# Patient Record
Sex: Female | Born: 1965 | Race: White | Hispanic: No | Marital: Married | Smoking: Never smoker
Health system: Southern US, Community
[De-identification: ages and names within clinical notes are randomized; demographics above are authoritative.]

## PROBLEM LIST (undated history)

## (undated) DIAGNOSIS — M179 Osteoarthritis of knee, unspecified: Secondary | ICD-10-CM

## (undated) DIAGNOSIS — M1710 Unilateral primary osteoarthritis, unspecified knee: Secondary | ICD-10-CM

## (undated) DIAGNOSIS — S83519A Sprain of anterior cruciate ligament of unspecified knee, initial encounter: Secondary | ICD-10-CM

## (undated) DIAGNOSIS — S83207A Unspecified tear of unspecified meniscus, current injury, left knee, initial encounter: Secondary | ICD-10-CM

## (undated) HISTORY — PX: BREAST BIOPSY: SHX20

---

## 1994-07-23 HISTORY — PX: TONSILLECTOMY: SUR1361

## 1997-10-08 ENCOUNTER — Other Ambulatory Visit: Admission: RE | Admit: 1997-10-08 | Discharge: 1997-10-08 | Payer: Self-pay | Admitting: Obstetrics and Gynecology

## 1998-04-11 ENCOUNTER — Inpatient Hospital Stay (HOSPITAL_COMMUNITY): Admission: AD | Admit: 1998-04-11 | Discharge: 1998-04-13 | Payer: Self-pay | Admitting: Obstetrics and Gynecology

## 1998-04-14 ENCOUNTER — Encounter (HOSPITAL_COMMUNITY): Admission: RE | Admit: 1998-04-14 | Discharge: 1998-07-13 | Payer: Self-pay | Admitting: Obstetrics and Gynecology

## 1998-05-13 ENCOUNTER — Other Ambulatory Visit: Admission: RE | Admit: 1998-05-13 | Discharge: 1998-05-13 | Payer: Self-pay | Admitting: Obstetrics and Gynecology

## 1999-03-15 ENCOUNTER — Other Ambulatory Visit: Admission: RE | Admit: 1999-03-15 | Discharge: 1999-03-15 | Payer: Self-pay | Admitting: Obstetrics and Gynecology

## 1999-09-22 ENCOUNTER — Inpatient Hospital Stay (HOSPITAL_COMMUNITY): Admission: AD | Admit: 1999-09-22 | Discharge: 1999-09-24 | Payer: Self-pay | Admitting: Obstetrics and Gynecology

## 1999-09-25 ENCOUNTER — Encounter: Admission: RE | Admit: 1999-09-25 | Discharge: 1999-11-21 | Payer: Self-pay | Admitting: Obstetrics and Gynecology

## 1999-10-27 ENCOUNTER — Other Ambulatory Visit: Admission: RE | Admit: 1999-10-27 | Discharge: 1999-10-27 | Payer: Self-pay | Admitting: Obstetrics and Gynecology

## 2000-10-07 ENCOUNTER — Other Ambulatory Visit: Admission: RE | Admit: 2000-10-07 | Discharge: 2000-10-07 | Payer: Self-pay | Admitting: Obstetrics and Gynecology

## 2001-04-25 ENCOUNTER — Inpatient Hospital Stay (HOSPITAL_COMMUNITY): Admission: AD | Admit: 2001-04-25 | Discharge: 2001-04-28 | Payer: Self-pay | Admitting: Obstetrics and Gynecology

## 2001-04-29 ENCOUNTER — Encounter: Admission: RE | Admit: 2001-04-29 | Discharge: 2001-05-29 | Payer: Self-pay | Admitting: Obstetrics and Gynecology

## 2001-05-30 ENCOUNTER — Encounter: Admission: RE | Admit: 2001-05-30 | Discharge: 2001-06-29 | Payer: Self-pay | Admitting: Obstetrics and Gynecology

## 2001-06-05 ENCOUNTER — Other Ambulatory Visit: Admission: RE | Admit: 2001-06-05 | Discharge: 2001-06-05 | Payer: Self-pay | Admitting: Obstetrics and Gynecology

## 2002-07-09 ENCOUNTER — Other Ambulatory Visit: Admission: RE | Admit: 2002-07-09 | Discharge: 2002-07-09 | Payer: Self-pay | Admitting: Obstetrics and Gynecology

## 2003-09-14 ENCOUNTER — Other Ambulatory Visit: Admission: RE | Admit: 2003-09-14 | Discharge: 2003-09-14 | Payer: Self-pay | Admitting: Obstetrics and Gynecology

## 2004-10-11 ENCOUNTER — Other Ambulatory Visit: Admission: RE | Admit: 2004-10-11 | Discharge: 2004-10-11 | Payer: Self-pay | Admitting: Obstetrics and Gynecology

## 2005-07-23 HISTORY — PX: TUBAL LIGATION: SHX77

## 2005-11-08 ENCOUNTER — Other Ambulatory Visit: Admission: RE | Admit: 2005-11-08 | Discharge: 2005-11-08 | Payer: Self-pay | Admitting: Obstetrics and Gynecology

## 2006-08-29 ENCOUNTER — Ambulatory Visit (HOSPITAL_COMMUNITY): Admission: RE | Admit: 2006-08-29 | Discharge: 2006-08-29 | Payer: Self-pay | Admitting: Obstetrics and Gynecology

## 2012-03-28 ENCOUNTER — Other Ambulatory Visit: Payer: Self-pay | Admitting: Obstetrics and Gynecology

## 2012-03-28 DIAGNOSIS — R928 Other abnormal and inconclusive findings on diagnostic imaging of breast: Secondary | ICD-10-CM

## 2012-04-01 ENCOUNTER — Ambulatory Visit
Admission: RE | Admit: 2012-04-01 | Discharge: 2012-04-01 | Disposition: A | Discharge status: Home / Self Care | Payer: 59 | Source: Ambulatory Visit | Attending: Obstetrics and Gynecology | Admitting: Obstetrics and Gynecology

## 2012-04-01 DIAGNOSIS — R928 Other abnormal and inconclusive findings on diagnostic imaging of breast: Secondary | ICD-10-CM

## 2012-04-23 ENCOUNTER — Other Ambulatory Visit: Payer: Self-pay | Admitting: Obstetrics and Gynecology

## 2012-04-23 DIAGNOSIS — R921 Mammographic calcification found on diagnostic imaging of breast: Secondary | ICD-10-CM

## 2012-05-01 ENCOUNTER — Other Ambulatory Visit: Payer: Self-pay | Admitting: Obstetrics and Gynecology

## 2012-05-01 ENCOUNTER — Ambulatory Visit
Admission: RE | Admit: 2012-05-01 | Discharge: 2012-05-01 | Disposition: A | Discharge status: Home / Self Care | Payer: 59 | Source: Ambulatory Visit | Attending: Obstetrics and Gynecology | Admitting: Obstetrics and Gynecology

## 2012-05-01 DIAGNOSIS — R921 Mammographic calcification found on diagnostic imaging of breast: Secondary | ICD-10-CM

## 2012-05-01 DIAGNOSIS — N6320 Unspecified lump in the left breast, unspecified quadrant: Secondary | ICD-10-CM

## 2013-06-23 ENCOUNTER — Other Ambulatory Visit: Payer: Self-pay | Admitting: Physician Assistant

## 2013-06-23 DIAGNOSIS — S0993XD Unspecified injury of face, subsequent encounter: Secondary | ICD-10-CM

## 2013-06-23 DIAGNOSIS — S0993XA Unspecified injury of face, initial encounter: Secondary | ICD-10-CM | POA: Insufficient documentation

## 2013-06-23 NOTE — Progress Notes (Signed)
Patient ID: Kathy [name], female   DOB: [birth year], 47 y.o.   MRN: [id number] Subjective:     Patient ID: Kathy [name] is a 47 y.o. female.  HPI The patient is a 47 yrs old wf here for evaluation of a laceration on her nose and forehead.  She was in the [place] when she fell off a boat and hit her head and knee.  She was sutured there three days ago.  There is some swelling so it is difficult to tell if it is broken. She does not have any difficulty breathing. The forehead laceration is healing and without signs of infection, but the edges did have some overlap and is prominent at this time. She does have some deviation of her nasal bridge to the left. She is not having problems with breathing. She is going to Dr. Jim Desanctis today for further assessment of her left knee and may have an ACL tear that may require surgery.   The following portions of the patient's history were reviewed and updated as appropriate: allergies, current medications, past family history, past medical history, past social history, past surgical history and problem list.  Review of Systems  All other systems reviewed and are negative.       Objective:   Physical Exam  Constitutional: She is oriented to person, place, and time. She appears well-developed and well-nourished. No distress.  Ambulates with brace on the left knee  HENT:  The forehead laceration is healing and without signs of infection, but the edges did have some overlap and is prominent at this time. She does have some deviation of her nasal bridge to the left. She is not having problems with breathing  Eyes: Conjunctivae and EOM are normal. Pupils are equal, round, and reactive to light.  Cardiovascular: Normal rate and regular rhythm.   Pulmonary/Chest: Effort normal. No respiratory distress.  Musculoskeletal:  Left knee with bracing  Neurological: She is alert and oriented to person, place, and time. No cranial nerve deficit. She exhibits normal  muscle tone. Coordination normal.  Skin: Skin is warm and dry. No rash noted. No erythema.  Psychiatric: She has a normal mood and affect. Her behavior is normal. Judgment and thought content normal.       Assessment:     1. Facial trauma, subsequent encounter         Plan:     Maxillo-facial CT scan to evaluate facial trauma She is likely going to need left knee surgery and we can Coordinate with  Orthopedics once we know the plan.      Electronically signed by: Kathy Lahaie Montgomery Advait Buice, PA-C 06/23/2013 9:58 AM

## 2013-06-26 ENCOUNTER — Ambulatory Visit (HOSPITAL_COMMUNITY)
Admission: RE | Admit: 2013-06-26 | Discharge: 2013-06-26 | Disposition: A | Discharge status: Home / Self Care | Payer: 59 | Source: Ambulatory Visit | Attending: Physician Assistant | Admitting: Physician Assistant

## 2013-06-26 ENCOUNTER — Encounter (HOSPITAL_COMMUNITY): Payer: Self-pay

## 2013-06-26 DIAGNOSIS — S022XXA Fracture of nasal bones, initial encounter for closed fracture: Secondary | ICD-10-CM | POA: Insufficient documentation

## 2013-06-26 DIAGNOSIS — X58XXXA Exposure to other specified factors, initial encounter: Secondary | ICD-10-CM | POA: Insufficient documentation

## 2013-06-26 DIAGNOSIS — S0993XD Unspecified injury of face, subsequent encounter: Secondary | ICD-10-CM

## 2013-07-07 DIAGNOSIS — S022XXG Fracture of nasal bones, subsequent encounter for fracture with delayed healing: Secondary | ICD-10-CM | POA: Insufficient documentation

## 2013-07-27 ENCOUNTER — Encounter (HOSPITAL_BASED_OUTPATIENT_CLINIC_OR_DEPARTMENT_OTHER): Payer: Self-pay | Admitting: *Deleted

## 2013-07-27 NOTE — Progress Notes (Signed)
NPO AFTER MN.  ARRIVE AT 0700.  NEEDS HG.  

## 2013-07-28 ENCOUNTER — Other Ambulatory Visit: Payer: Self-pay | Admitting: Plastic Surgery

## 2013-07-28 DIAGNOSIS — S022XXD Fracture of nasal bones, subsequent encounter for fracture with routine healing: Secondary | ICD-10-CM

## 2013-07-28 NOTE — H&P (Signed)
Kathy [name] Darrah  07/07/2013 8:30 AM   Office Visit  MRN:  [id number]  Department:  Plastic Surgery  Dept Phone: [phone]   Description: Female DOB: 11/05/1965  Provider: Wayland Denislaire [name], DO    Diagnoses    Facial trauma, sequela     908.9    Closed displaced fracture of nasal bone with delayed healing   V54.19     Vitals - Last Recorded   100/63  67  1.651 m (5\' 5" )  67.132 kg (148 lb)  24.63 kg/m2  98%     Subjective:    Patient ID: Kathy [name] is a 48 y.o. female.  HPI The patient is a 48 yrs old wf here for follow up of laceration on her nose and forehead.  [place] she fell off a boat and hit her head and knee.  She was sutured there.   The forehead laceration is healing and without signs of infection, but the edges did have some overlap and is prominent at this time. She does have some deviation of her nasal bridge to the left. She is not having problems with breathing. She had a Maxillo-facial CT scan which showed displaced nasal and septal fractures. She is here for a history and physical today prior to repair of the fractures.   The following portions of the patient's history were reviewed and updated as appropriate: allergies, current medications, past family history, past medical history, past social history, past surgical history and problem list.  Review of Systems  All other systems reviewed and are negative.     Objective:   Physical Exam  Constitutional: She is oriented to person, place, and time. She appears well-developed and well-nourished. No distress.  HENT:  Nasal deformity with deviation of nose.  Forehead with scarring and overlap of laceration which was sutured at the time of injury.   Eyes: Conjunctivae and EOM are normal. Pupils are equal, round, and reactive to light.  Neck: Normal range of motion. Neck supple. No tracheal deviation present. No thyromegaly present.  Cardiovascular: Normal rate, regular rhythm, normal heart sounds and  intact distal pulses.  Exam reveals no gallop and no friction rub.    No murmur heard. Pulmonary/Chest: Effort normal and breath sounds normal. No stridor. No respiratory distress. She has no wheezes. She exhibits no tenderness.  Abdominal: Soft. Bowel sounds are normal. She exhibits no mass. There is no tenderness.  Musculoskeletal:  Left knee injury with bracing and ambulates with limp due to this.   Neurological: She is alert and oriented to person, place, and time. No cranial nerve deficit. She exhibits normal muscle tone. Coordination normal.  Skin: Skin is warm and dry. No rash noted. No erythema.  Psychiatric: She has a normal mood and affect. Her behavior is normal. Judgment and thought content normal.      Assessment:   1.  Facial trauma, sequela    2.  Closed displaced fracture of nasal bone with delayed healing       Plan:   Plan closed reduction of nasal and septal fractures on 07/29/2013.   The procedure, possible risks, benefits and complications were discussed with the patient and she desires to proceed and consent was obtained.   follow up 1 week following surgery     Medications Ordered This Encounter    HYDROcodone-acetaminophen (NORCO) 5-325 mg per tablet Take 1 tablet by mouth every 6 (six) hours as needed for up to 10 days for Pain.  cephalexin (KEFLEX) 500 MG capsule  Take 1 capsule (500 mg total) by mouth 4 times daily.

## 2013-07-29 ENCOUNTER — Encounter (HOSPITAL_BASED_OUTPATIENT_CLINIC_OR_DEPARTMENT_OTHER): Payer: 59 | Admitting: Anesthesiology

## 2013-07-29 ENCOUNTER — Ambulatory Visit (HOSPITAL_BASED_OUTPATIENT_CLINIC_OR_DEPARTMENT_OTHER): Payer: 59 | Admitting: Anesthesiology

## 2013-07-29 ENCOUNTER — Encounter (HOSPITAL_BASED_OUTPATIENT_CLINIC_OR_DEPARTMENT_OTHER)
Admission: RE | Disposition: A | Discharge status: Home / Self Care | Payer: Self-pay | Source: Ambulatory Visit | Attending: Plastic Surgery

## 2013-07-29 ENCOUNTER — Ambulatory Visit (HOSPITAL_BASED_OUTPATIENT_CLINIC_OR_DEPARTMENT_OTHER)
Admission: RE | Admit: 2013-07-29 | Discharge: 2013-07-29 | Disposition: A | Discharge status: Home / Self Care | Payer: 59 | Source: Ambulatory Visit | Attending: Plastic Surgery | Admitting: Plastic Surgery

## 2013-07-29 ENCOUNTER — Encounter (HOSPITAL_BASED_OUTPATIENT_CLINIC_OR_DEPARTMENT_OTHER): Payer: Self-pay | Admitting: *Deleted

## 2013-07-29 DIAGNOSIS — S0180XA Unspecified open wound of other part of head, initial encounter: Secondary | ICD-10-CM | POA: Insufficient documentation

## 2013-07-29 DIAGNOSIS — S022XXA Fracture of nasal bones, initial encounter for closed fracture: Secondary | ICD-10-CM | POA: Insufficient documentation

## 2013-07-29 DIAGNOSIS — S022XXD Fracture of nasal bones, subsequent encounter for fracture with routine healing: Secondary | ICD-10-CM

## 2013-07-29 HISTORY — PX: CLOSED REDUCTION NASAL FRACTURE: SHX5365

## 2013-07-29 SURGERY — CLOSED REDUCTION, FRACTURE, NASAL BONE
Anesthesia: General | Site: Nose

## 2013-07-29 MED ORDER — COCAINE HCL 4 % EX SOLN
CUTANEOUS | Status: DC | PRN
  Administered 2013-07-29: 1 mL via TOPICAL

## 2013-07-29 MED ORDER — BUPIVACAINE-EPINEPHRINE 0.25% -1:200000 IJ SOLN
INTRAMUSCULAR | Status: DC | PRN
  Administered 2013-07-29: 7 mL

## 2013-07-29 MED ORDER — MIDAZOLAM HCL 2 MG/2ML IJ SOLN
INTRAMUSCULAR | Status: AC
  Filled 2013-07-29: qty 4

## 2013-07-29 MED ORDER — CEFAZOLIN SODIUM-DEXTROSE 2-3 GM-% IV SOLR
2.0000 g | INTRAVENOUS | Status: DC
  Filled 2013-07-29: qty 50

## 2013-07-29 MED ORDER — ROCURONIUM BROMIDE 100 MG/10ML IV SOLN
INTRAVENOUS | Status: DC | PRN
  Administered 2013-07-29: 10 mg via INTRAVENOUS
  Administered 2013-07-29: 30 mg via INTRAVENOUS

## 2013-07-29 MED ORDER — PROPOFOL 10 MG/ML IV BOLUS
INTRAVENOUS | Status: DC | PRN
  Administered 2013-07-29: 50 mg via INTRAVENOUS
  Administered 2013-07-29: 200 mg via INTRAVENOUS

## 2013-07-29 MED ORDER — EPHEDRINE SULFATE 50 MG/ML IJ SOLN
INTRAMUSCULAR | Status: DC | PRN
  Administered 2013-07-29 (×2): 10 mg via INTRAVENOUS

## 2013-07-29 MED ORDER — CEFAZOLIN SODIUM-DEXTROSE 2-3 GM-% IV SOLR
INTRAVENOUS | Status: DC | PRN
  Administered 2013-07-29: 2 g via INTRAVENOUS

## 2013-07-29 MED ORDER — PROMETHAZINE HCL 25 MG/ML IJ SOLN
6.2500 mg | INTRAMUSCULAR | Status: DC | PRN
  Filled 2013-07-29: qty 1

## 2013-07-29 MED ORDER — GLYCOPYRROLATE 0.2 MG/ML IJ SOLN
INTRAMUSCULAR | Status: DC | PRN
  Administered 2013-07-29: 0.2 mg via INTRAVENOUS
  Administered 2013-07-29: 0.4 mg via INTRAVENOUS

## 2013-07-29 MED ORDER — SUCCINYLCHOLINE CHLORIDE 20 MG/ML IJ SOLN
INTRAMUSCULAR | Status: DC | PRN
  Administered 2013-07-29: 100 mg via INTRAVENOUS

## 2013-07-29 MED ORDER — LIDOCAINE-EPINEPHRINE 1 %-1:100000 IJ SOLN
INTRAMUSCULAR | Status: DC | PRN
  Administered 2013-07-29: 12 mL

## 2013-07-29 MED ORDER — LIDOCAINE HCL (CARDIAC) 20 MG/ML IV SOLN
INTRAVENOUS | Status: DC | PRN
  Administered 2013-07-29: 100 mg via INTRAVENOUS

## 2013-07-29 MED ORDER — ONDANSETRON HCL 4 MG/2ML IJ SOLN
INTRAMUSCULAR | Status: DC | PRN
  Administered 2013-07-29: 4 mg via INTRAVENOUS

## 2013-07-29 MED ORDER — LACTATED RINGERS IV SOLN
INTRAVENOUS | Status: DC
  Administered 2013-07-29 (×2): via INTRAVENOUS
  Filled 2013-07-29: qty 1000

## 2013-07-29 MED ORDER — FENTANYL CITRATE 0.05 MG/ML IJ SOLN
INTRAMUSCULAR | Status: DC | PRN
  Administered 2013-07-29 (×2): 25 ug via INTRAVENOUS
  Administered 2013-07-29: 50 ug via INTRAVENOUS
  Administered 2013-07-29 (×5): 25 ug via INTRAVENOUS
  Administered 2013-07-29: 50 ug via INTRAVENOUS
  Administered 2013-07-29: 25 ug via INTRAVENOUS

## 2013-07-29 MED ORDER — FENTANYL CITRATE 0.05 MG/ML IJ SOLN
INTRAMUSCULAR | Status: AC
  Filled 2013-07-29: qty 6

## 2013-07-29 MED ORDER — KETOROLAC TROMETHAMINE 30 MG/ML IJ SOLN
INTRAMUSCULAR | Status: DC | PRN
  Administered 2013-07-29: 30 mg via INTRAVENOUS

## 2013-07-29 MED ORDER — MEPERIDINE HCL 25 MG/ML IJ SOLN
6.2500 mg | INTRAMUSCULAR | Status: DC | PRN
  Filled 2013-07-29: qty 1

## 2013-07-29 MED ORDER — NEOSTIGMINE METHYLSULFATE 1 MG/ML IJ SOLN
INTRAMUSCULAR | Status: DC | PRN
Start: 2013-07-29 — End: 2013-07-29
  Administered 2013-07-29: 3 mg via INTRAVENOUS

## 2013-07-29 MED ORDER — MIDAZOLAM HCL 5 MG/5ML IJ SOLN
INTRAMUSCULAR | Status: DC | PRN
  Administered 2013-07-29 (×2): 1 mg via INTRAVENOUS

## 2013-07-29 MED ORDER — FENTANYL CITRATE 0.05 MG/ML IJ SOLN
25.0000 ug | INTRAMUSCULAR | Status: DC | PRN
  Filled 2013-07-29: qty 1

## 2013-07-29 MED ORDER — LACTATED RINGERS IV SOLN
INTRAVENOUS | Status: DC
  Filled 2013-07-29: qty 1000

## 2013-07-29 MED ORDER — DEXAMETHASONE SODIUM PHOSPHATE 4 MG/ML IJ SOLN
INTRAMUSCULAR | Status: DC | PRN
  Administered 2013-07-29: 10 mg via INTRAVENOUS

## 2013-07-29 MED ORDER — LACTATED RINGERS IV SOLN
INTRAVENOUS | Status: DC | PRN
  Administered 2013-07-29 (×2): via INTRAVENOUS

## 2013-07-29 MED ORDER — OXYMETAZOLINE HCL 0.05 % NA SOLN
NASAL | Status: DC | PRN
  Administered 2013-07-29: 1 via NASAL

## 2013-07-29 SURGICAL SUPPLY — 39 items
BENZOIN TINCTURE PRP APPL 2/3 (GAUZE/BANDAGES/DRESSINGS) ×2 IMPLANT
CANISTER SUCTION 1200CC (MISCELLANEOUS) ×2 IMPLANT
CLOTH BEACON ORANGE TIMEOUT ST (SAFETY) IMPLANT
DECANTER SPIKE VIAL GLASS SM (MISCELLANEOUS) IMPLANT
ELECT NEEDLE BLADE 2-5/6 (NEEDLE) ×2 IMPLANT
ELECT REM PT RETURN 9FT ADLT (ELECTROSURGICAL) ×2
ELECTRODE REM PT RTRN 9FT ADLT (ELECTROSURGICAL) ×1 IMPLANT
GAUZE VASELINE FOILPK 1/2 X 72 (GAUZE/BANDAGES/DRESSINGS) IMPLANT
GLOVE BIO SURGEON STRL SZ 6.5 (GLOVE) ×2 IMPLANT
GLOVE BIOGEL M 6.5 STRL (GLOVE) ×6 IMPLANT
GLOVE BIOGEL M STER SZ 6 (GLOVE) ×2 IMPLANT
GLOVE BIOGEL M STRL SZ7.5 (GLOVE) ×2 IMPLANT
GOWN PREVENTION PLUS XLARGE (GOWN DISPOSABLE) ×2 IMPLANT
NDL SAFETY ECLIPSE 18X1.5 (NEEDLE) ×1 IMPLANT
NEEDLE 27GAX1X1/2 (NEEDLE) ×2 IMPLANT
NEEDLE HYPO 18GX1.5 SHARP (NEEDLE) ×1
NEEDLE HYPO 22GX1.5 SAFETY (NEEDLE) ×2 IMPLANT
NEEDLE HYPO 30X.5 LL (NEEDLE) ×2 IMPLANT
PACK BASIN DAY SURGERY FS (CUSTOM PROCEDURE TRAY) ×2 IMPLANT
PACK ENT DAY SURGERY (CUSTOM PROCEDURE TRAY) ×2 IMPLANT
PATTIES SURGICAL .5 X3 (DISPOSABLE) ×2 IMPLANT
PENCIL BUTTON HOLSTER BLD 10FT (ELECTRODE) ×2 IMPLANT
SPLINT ELECTRIC BLUE LARGE (MISCELLANEOUS) IMPLANT
SPLINT ELECTRIC BLUE SMALL (MISCELLANEOUS) IMPLANT
SPLINT HOT PINK LARGE (MISCELLANEOUS) IMPLANT
SPLINT HOT PINK SMALL (MISCELLANEOUS) IMPLANT
SPLINT IVORY SMALL (MISCELLANEOUS) IMPLANT
SPLINT NASAL DOYLE BI-VL (GAUZE/BANDAGES/DRESSINGS) IMPLANT
SPLINT NASAL THERMO PLAST (MISCELLANEOUS) ×2 IMPLANT
SPLINT THERMO PLAST IVORY LRG (MISCELLANEOUS) IMPLANT
SPONGE GAUZE 2X2 8PLY STRL LF (GAUZE/BANDAGES/DRESSINGS) IMPLANT
STRIP CLOSURE SKIN 1/2X4 (GAUZE/BANDAGES/DRESSINGS) ×2 IMPLANT
SUCTION FRAZIER TIP 10 FR DISP (SUCTIONS) ×2 IMPLANT
SUT ETHILON 3 0 PS 1 (SUTURE) IMPLANT
SUT PLAIN 3 0 FS 2 27 (SUTURE) ×2 IMPLANT
TOWEL OR 17X24 6PK STRL BLUE (TOWEL DISPOSABLE) ×4 IMPLANT
TRAY DSU PREP LF (CUSTOM PROCEDURE TRAY) ×2 IMPLANT
TUBE SALEM SUMP 16 FR W/ARV (TUBING) ×2 IMPLANT
YANKAUER SUCT BULB TIP NO VENT (SUCTIONS) IMPLANT

## 2013-07-29 NOTE — Op Note (Signed)
Operative Note   DATE OF OPERATION: 07/29/2013  [facility]ATION[facility]  SURGICAL DIVISION: Plastic Surgery  PREOPERATIVE DIAGNOSES:  Nasal septal fracture  POSTOPERATIVE DIAGNOSES:  same  PROCEDURE:  Open reduction of nasal septal fracture  SURGEON: Wayland Denislaire [name], DO  ASSISTANT: Shawn Rayburn, PA  ANESTHESIA:  General.   COMPLICATIONS: None.   INDICATIONS FOR PROCEDURE:  The patient, Kathy [name], is a 48 y.o. female born on 02/16/1966, is here for treatment of nasal septal fracture.   CONSENT:  Informed consent was obtained directly from the patient. Risks, benefits and alternatives were fully discussed. Specific risks including but not limited to bleeding, infection, hematoma, seroma, scarring, pain, implant infection, implant extrusion, capsular contracture, asymmetry, wound healing problems, and need for further surgery were all discussed. The patient did have an ample opportunity to have questions answered to satisfaction.   DESCRIPTION OF PROCEDURE:  The patient was taken to the operating room. SCDs were placed and IV antibiotics were given. The patient's operative site was prepped and draped in a sterile fashion. A time out was performed and all information was confirmed to be correct.  General and local anesthesia was administered.  The local was injected in the septal area as well as in the periosteal nasal bone area.  The 15 blade was used to make an incision at the base of the septal cartilage.  The freer was used to separate the mucosa from the cartilage on both sides.  The septal cartilage at the juncture of the spine was excised.  This freed up the cartilage to be mobile.  There was a moderate amount of scarring.  A transfiction stitch was placed plain gut 3-0 through the cartilage to secure it to the spine. The incision was closed with 5-0 Plain gut.  The #15 blade was used to make an incision at the base of the lateral nasal bones caudad on each side.  The  freer was used to release the periosteum.  The osteotome and hammer were then used to fracture the nasal bones and allow repositioning with symmetry.  Splints were placed at the septum and secured with nylon.  The steri strips were placed on the skin of the nose and a denver splint applied. The patient's posterior pharynx was aspirated.  She tolerated the procedure well.  There were no complications.  The patient was allowed to wake from anesthesia, extubated and taken to the recovery room in satisfactory condition.

## 2013-07-29 NOTE — Anesthesia Procedure Notes (Signed)
Procedure Name: Intubation Date/Time: 07/29/2013 8:30 AM Performed by: Jessica PriestBEESON, Kathy Robinette C Pre-anesthesia Checklist: Patient identified, Emergency Drugs available, Suction available and Patient being monitored Patient Re-evaluated:Patient Re-evaluated prior to inductionOxygen Delivery Method: Circle System Utilized Preoxygenation: Pre-oxygenation with 100% oxygen Intubation Type: IV induction Ventilation: Mask ventilation without difficulty Laryngoscope Size: Mac and 3 Grade View: Grade I Tube type: Oral (extension piece applied to circiut) Tube size: 7.0 mm Number of attempts: 1 Airway Equipment and Method: stylet and oral airway Placement Confirmation: ETT inserted through vocal cords under direct vision,  positive ETCO2 and breath sounds checked- equal and bilateral Secured at: 20 cm Tube secured with: Tape Dental Injury: Teeth and Oropharynx as per pre-operative assessment

## 2013-07-29 NOTE — Anesthesia Postprocedure Evaluation (Signed)
  Anesthesia Post-op Note  Patient: Kathy [name]  Procedure(s) Performed: Procedure(s) (LRB):  OPEN REPAIR OF SEPTAL NASAL FRACTURE (N/A)  Patient Location: PACU  Anesthesia Type: General  Level of Consciousness: awake and alert   Airway and Oxygen Therapy: Patient Spontanous Breathing  Post-op Pain: mild  Post-op Assessment: Post-op Vital signs reviewed, Patient's Cardiovascular Status Stable, Respiratory Function Stable, Patent Airway and No signs of Nausea or vomiting  Last Vitals:  Filed Vitals:   07/29/13 1115  BP: 107/51  Pulse: 68  Temp:   Resp: 11    Post-op Vital Signs: stable   Complications: No apparent anesthesia complications

## 2013-07-29 NOTE — Interval H&P Note (Signed)
History and Physical Interval Note:  07/29/2013 7:58 AM  Kathy RothmanMelinda C Barman  has presented today for surgery, with the diagnosis of Nasal Fracture  The various methods of treatment have been discussed with the patient and family. After consideration of risks, benefits and other options for treatment, the patient has consented to  Procedure(s): CLOSED REDUCTION NASAL AND SEPTAL FRACTURE (N/A) as a surgical intervention.  Possible open approach was also discussed and agreed upon by the patient.  The patient's history has been reviewed, patient examined, no change in status, stable for surgery.  I have reviewed the patient's chart and labs.  Questions were answered to the patient's satisfaction.     [name],[name]

## 2013-07-29 NOTE — Brief Op Note (Signed)
07/29/2013  10:01 AM  PATIENT:  Kathy RothmanMelinda C [name]  48 y.o. female  PRE-OPERATIVE DIAGNOSIS:  Nasal Fracture  POST-OPERATIVE DIAGNOSIS:  Nasal Fracture  PROCEDURE:  Procedure(s):  OPEN REPAIR OF SEPTAL NASAL FRACTURE (N/A)  SURGEON:  Surgeon(s) and Role:    * Claire [name], DO - Primary    * Joanna PuffL. L Patseavouras, MD - Assisting  PHYSICIAN ASSISTANT: none  ASSISTANTS: none   ANESTHESIA:   local  EBL:  Total I/O In: 1000 [I.V.:1000] Out: -   BLOOD ADMINISTERED:none  DRAINS: none   LOCAL MEDICATIONS USED:  MARCAINE    and LIDOCAINE   SPECIMEN:  No Specimen  DISPOSITION OF SPECIMEN:  N/A  COUNTS:  YES  TOURNIQUET:  * No tourniquets in log *  DICTATION: .Dragon Dictation  PLAN OF CARE: Discharge to home after PACU  PATIENT DISPOSITION:  PACU - hemodynamically stable.   Delay start of Pharmacological VTE agent (>24hrs) due to surgical blood loss or risk of bleeding: no

## 2013-07-29 NOTE — Anesthesia Preprocedure Evaluation (Signed)

## 2013-07-29 NOTE — Transfer of Care (Signed)
Immediate Anesthesia Transfer of Care Note  Patient: Kathy RothmanMelinda C [name]  Procedure(s) Performed: Procedure(s) (LRB):  OPEN REPAIR OF SEPTAL NASAL FRACTURE (N/A)  Patient Location: PACU  Anesthesia Type: General  Level of Consciousness: awake, sedated, patient cooperative and responds to stimulation  Airway & Oxygen Therapy: Patient Spontanous Breathing and Patient connected to face mask oxygen, which was modified for no compression on nasal area- padded with pink tape along edges- changed to humidified FM system  Post-op Assessment: Report given to PACU RN, Post -op Vital signs reviewed and stable and Patient moving all extremities  Post vital signs: Reviewed and stable  Complications: No apparent anesthesia complications

## 2013-07-29 NOTE — H&P (View-Only) (Signed)
Kathy [name] Darrah  07/07/2013 8:30 AM   Office Visit  MRN:  [id number]  Department:  Plastic Surgery  Dept Phone: [phone]   Description: Female DOB: 11/05/1965  Provider: Wayland Denislaire [name], DO    Diagnoses    Facial trauma, sequela     908.9    Closed displaced fracture of nasal bone with delayed healing   V54.19     Vitals - Last Recorded   100/63  67  1.651 m (5\' 5" )  67.132 kg (148 lb)  24.63 kg/m2  98%     Subjective:    Patient ID: Kathy [name] is a 48 y.o. female.  HPI The patient is a 48 yrs old wf here for follow up of laceration on her nose and forehead.  [place] she fell [place] a boat and hit her head and knee.  She was sutured there.   The forehead laceration is healing and without signs of infection, but the edges did have some overlap and is prominent at this time. She does have some deviation of her nasal bridge to the left. She is not having problems with breathing. She had a Maxillo-facial CT scan which showed displaced nasal and septal fractures. She is here for a history and physical today prior to repair of the fractures.   The following portions of the patient's history were reviewed and updated as appropriate: allergies, current medications, past family history, past medical history, past social history, past surgical history and problem list.  Review of Systems  All other systems reviewed and are negative.     Objective:   Physical Exam  Constitutional: She is oriented to person, place, and time. She appears well-developed and well-nourished. No distress.  HENT:  Nasal deformity with deviation of nose.  Forehead with scarring and overlap of laceration which was sutured at the time of injury.   Eyes: Conjunctivae and EOM are normal. Pupils are equal, round, and reactive to light.  Neck: Normal range of motion. Neck supple. No tracheal deviation present. No thyromegaly present.  Cardiovascular: Normal rate, regular rhythm, normal heart sounds and  intact distal pulses.  Exam reveals no gallop and no friction rub.    No murmur heard. Pulmonary/Chest: Effort normal and breath sounds normal. No stridor. No respiratory distress. She has no wheezes. She exhibits no tenderness.  Abdominal: Soft. Bowel sounds are normal. She exhibits no mass. There is no tenderness.  Musculoskeletal:  Left knee injury with bracing and ambulates with limp due to this.   Neurological: She is alert and oriented to person, place, and time. No cranial nerve deficit. She exhibits normal muscle tone. Coordination normal.  Skin: Skin is warm and dry. No rash noted. No erythema.  Psychiatric: She has a normal mood and affect. Her behavior is normal. Judgment and thought content normal.      Assessment:   1.  Facial trauma, sequela    2.  Closed displaced fracture of nasal bone with delayed healing       Plan:   Plan closed reduction of nasal and septal fractures on 07/29/2013.   The procedure, possible risks, benefits and complications were discussed with the patient and she desires to proceed and consent was obtained.   follow up 1 week following surgery     Medications Ordered This Encounter    HYDROcodone-acetaminophen (NORCO) 5-325 mg per tablet Take 1 tablet by mouth every 6 (six) hours as needed for up to 10 days for Pain.  cephalexin (KEFLEX) 500 MG capsule  Take 1 capsule (500 mg total) by mouth 4 times daily.

## 2013-07-29 NOTE — Discharge Instructions (Signed)
Keep splints in place No heavy lifting No nose blowing Post Anesthesia Home Care Instructions  Activity: Get plenty of rest for the remainder of the day. A responsible adult should stay with you for 24 hours following the procedure.  For the next 24 hours, DO NOT: -Drive a car -Advertising copywriterperate machinery -Drink alcoholic beverages -Take any medication unless instructed by your physician -Make any legal decisions or sign important papers.  Meals: Start with liquid foods such as gelatin or soup. Progress to regular foods as tolerated. Avoid greasy, spicy, heavy foods. If nausea and/or vomiting occur, drink only clear liquids until the nausea and/or vomiting subsides. Call your physician if vomiting continues.  Special Instructions/Symptoms: Your throat may feel dry or sore from the anesthesia or the breathing tube placed in your throat during surgery. If this causes discomfort, gargle with warm salt water. The discomfort should disappear within 24 hours. Nasal Surgery Care After These instructions give you information on caring for yourself after your procedure. Your doctor may also give you more specific instructions. Call your doctor if you have any problems or questions after your procedure. HOME CARE  Sleep with your head up on 2 or 3 pillows.  Hold a cold, damp wash cloth to your face, eyes, and nose for 20 minutes, every 2 hours during the day.  Wash your face very gently with mild soap and water 2 times a day.  Change your nasal drip pad as often as you need to.  Do not bend over or lift more than 10 pounds (about the weight of a gallon of milk).  Do not chew gum or eat any food that needs a lot of chewing.  Do not bump or hurt your nose.  Do not blow your nose for 1 week.  Do not sniff or sneeze unless your mouth is open.  Be sure to keep your follow-up appointment. GET HELP RIGHT AWAY IF:   You have any questions or concerns.  You or your child has a temperature by mouth  about 102 F (38.9 C).  You notice bad smelling fluid (drainage) coming from the surgery site (incision) or your nose. MAKE SURE YOU:  Understand these instructions.  Will watch your condition.  Will get help right away if you are not doing well or get worse. Document Released: 10/05/2008 Document Revised: 10/01/2011 Document Reviewed: 10/05/2008 [facility] Patient Information 2014 [employer] [place].

## 2013-07-30 ENCOUNTER — Encounter (HOSPITAL_BASED_OUTPATIENT_CLINIC_OR_DEPARTMENT_OTHER): Payer: Self-pay | Admitting: Plastic Surgery

## 2013-07-30 DIAGNOSIS — Z7189 Other specified counseling: Secondary | ICD-10-CM | POA: Insufficient documentation

## 2013-07-30 LAB — POCT HEMOGLOBIN-HEMACUE: Hemoglobin: 13.3 g/dL (ref 12.0–15.0)

## 2013-08-26 ENCOUNTER — Other Ambulatory Visit: Payer: Self-pay | Admitting: Orthopedic Surgery

## 2013-09-09 ENCOUNTER — Encounter (HOSPITAL_BASED_OUTPATIENT_CLINIC_OR_DEPARTMENT_OTHER): Payer: Self-pay | Admitting: *Deleted

## 2013-09-09 NOTE — Progress Notes (Signed)
NPO AFTER MN WITH EXCEPTION CLEAR LIQUIDS UNTIL 0700 (NO CREAM/ PRODUCTS). ARRIVE AT 1100. NEEDS ISTAT. WILL TAKE PAXIL AND IF NEEDED HYDROCODONE AM DOS W/ SIPS OF WATER.

## 2013-09-11 ENCOUNTER — Ambulatory Visit (HOSPITAL_BASED_OUTPATIENT_CLINIC_OR_DEPARTMENT_OTHER)
Admission: RE | Admit: 2013-09-11 | Discharge: 2013-09-11 | Disposition: A | Discharge status: Home / Self Care | Payer: 59 | Source: Ambulatory Visit | Attending: Specialist | Admitting: Specialist

## 2013-09-11 ENCOUNTER — Encounter (HOSPITAL_BASED_OUTPATIENT_CLINIC_OR_DEPARTMENT_OTHER)
Admission: RE | Disposition: A | Discharge status: Home / Self Care | Payer: Self-pay | Source: Ambulatory Visit | Attending: Specialist

## 2013-09-11 ENCOUNTER — Encounter (HOSPITAL_BASED_OUTPATIENT_CLINIC_OR_DEPARTMENT_OTHER): Payer: Self-pay

## 2013-09-11 ENCOUNTER — Ambulatory Visit (HOSPITAL_BASED_OUTPATIENT_CLINIC_OR_DEPARTMENT_OTHER): Payer: 59 | Admitting: Anesthesiology

## 2013-09-11 ENCOUNTER — Encounter (HOSPITAL_BASED_OUTPATIENT_CLINIC_OR_DEPARTMENT_OTHER): Payer: 59 | Admitting: Anesthesiology

## 2013-09-11 DIAGNOSIS — S83509A Sprain of unspecified cruciate ligament of unspecified knee, initial encounter: Secondary | ICD-10-CM | POA: Insufficient documentation

## 2013-09-11 DIAGNOSIS — Z9889 Other specified postprocedural states: Secondary | ICD-10-CM

## 2013-09-11 DIAGNOSIS — M2240 Chondromalacia patellae, unspecified knee: Secondary | ICD-10-CM | POA: Insufficient documentation

## 2013-09-11 DIAGNOSIS — X58XXXA Exposure to other specified factors, initial encounter: Secondary | ICD-10-CM | POA: Insufficient documentation

## 2013-09-11 DIAGNOSIS — S83419A Sprain of medial collateral ligament of unspecified knee, initial encounter: Secondary | ICD-10-CM | POA: Insufficient documentation

## 2013-09-11 DIAGNOSIS — M1710 Unilateral primary osteoarthritis, unspecified knee: Secondary | ICD-10-CM | POA: Insufficient documentation

## 2013-09-11 DIAGNOSIS — Z79899 Other long term (current) drug therapy: Secondary | ICD-10-CM | POA: Insufficient documentation

## 2013-09-11 HISTORY — DX: Osteoarthritis of knee, unspecified: M17.9

## 2013-09-11 HISTORY — DX: Unspecified tear of unspecified meniscus, current injury, left knee, initial encounter: S83.207A

## 2013-09-11 HISTORY — DX: Sprain of anterior cruciate ligament of unspecified knee, initial encounter: S83.519A

## 2013-09-11 HISTORY — DX: Unilateral primary osteoarthritis, unspecified knee: M17.10

## 2013-09-11 HISTORY — PX: KNEE ARTHROSCOPY WITH ANTERIOR CRUCIATE LIGAMENT (ACL) REPAIR WITH HAMSTRING GRAFT: SHX5645

## 2013-09-11 LAB — POCT I-STAT 4, (NA,K, GLUC, HGB,HCT)
Glucose, Bld: 99 mg/dL (ref 70–99)
HCT: 37 % (ref 36.0–46.0)
Hemoglobin: 12.6 g/dL (ref 12.0–15.0)
Potassium: 3.6 mEq/L — ABNORMAL LOW (ref 3.7–5.3)
Sodium: 139 mEq/L (ref 137–147)

## 2013-09-11 SURGERY — KNEE ARTHROSCOPY WITH ANTERIOR CRUCIATE LIGAMENT (ACL) REPAIR WITH HAMSTRING GRAFT
Anesthesia: Regional | Site: Knee | Laterality: Left

## 2013-09-11 MED ORDER — METHOCARBAMOL 500 MG PO TABS: 500.0000 mg | ORAL_TABLET | Freq: Four times a day (QID) | ORAL | Status: DC | PRN

## 2013-09-11 MED ORDER — FENTANYL CITRATE 0.05 MG/ML IJ SOLN
25.0000 ug | INTRAMUSCULAR | Status: DC | PRN
  Filled 2013-09-11: qty 1

## 2013-09-11 MED ORDER — SODIUM CHLORIDE 0.9 % IV SOLN
INTRAVENOUS | Status: DC
  Filled 2013-09-11: qty 1000

## 2013-09-11 MED ORDER — PROPOFOL 10 MG/ML IV BOLUS
INTRAVENOUS | Status: DC | PRN
  Administered 2013-09-11: 200 mg via INTRAVENOUS

## 2013-09-11 MED ORDER — HYDROMORPHONE HCL PF 1 MG/ML IJ SOLN
0.2500 mg | INTRAMUSCULAR | Status: DC | PRN
  Filled 2013-09-11: qty 1

## 2013-09-11 MED ORDER — PROMETHAZINE HCL 25 MG/ML IJ SOLN
6.2500 mg | INTRAMUSCULAR | Status: DC | PRN
  Filled 2013-09-11: qty 1

## 2013-09-11 MED ORDER — CEPHALEXIN 500 MG PO CAPS: 500.0000 mg | ORAL_CAPSULE | Freq: Three times a day (TID) | ORAL | Status: DC

## 2013-09-11 MED ORDER — METOCLOPRAMIDE HCL 5 MG/ML IJ SOLN
INTRAMUSCULAR | Status: DC | PRN
  Administered 2013-09-11: 10 mg via INTRAVENOUS

## 2013-09-11 MED ORDER — FENTANYL CITRATE 0.05 MG/ML IJ SOLN
INTRAMUSCULAR | Status: DC | PRN
  Administered 2013-09-11 (×2): 25 ug via INTRAVENOUS
  Administered 2013-09-11 (×2): 50 ug via INTRAVENOUS

## 2013-09-11 MED ORDER — OXYCODONE HCL 5 MG PO TABS
5.0000 mg | ORAL_TABLET | Freq: Once | ORAL | Status: DC | PRN
  Filled 2013-09-11: qty 1

## 2013-09-11 MED ORDER — CEFAZOLIN SODIUM-DEXTROSE 2-3 GM-% IV SOLR
2.0000 g | INTRAVENOUS | Status: AC
Start: 2013-09-11 — End: 2013-09-11
  Administered 2013-09-11: 2 g via INTRAVENOUS
  Filled 2013-09-11: qty 50

## 2013-09-11 MED ORDER — MIDAZOLAM HCL 2 MG/2ML IJ SOLN
INTRAMUSCULAR | Status: AC
  Filled 2013-09-11: qty 2

## 2013-09-11 MED ORDER — KETOROLAC TROMETHAMINE 30 MG/ML IJ SOLN
INTRAMUSCULAR | Status: DC | PRN
  Administered 2013-09-11: 30 mg via INTRAVENOUS

## 2013-09-11 MED ORDER — ONDANSETRON HCL 4 MG/2ML IJ SOLN
INTRAMUSCULAR | Status: DC | PRN
  Administered 2013-09-11: 4 mg via INTRAVENOUS

## 2013-09-11 MED ORDER — MEPERIDINE HCL 25 MG/ML IJ SOLN
6.2500 mg | INTRAMUSCULAR | Status: DC | PRN
  Filled 2013-09-11: qty 1

## 2013-09-11 MED ORDER — ACETAMINOPHEN 10 MG/ML IV SOLN
INTRAVENOUS | Status: DC | PRN
  Administered 2013-09-11: 1000 mg via INTRAVENOUS

## 2013-09-11 MED ORDER — OXYCODONE-ACETAMINOPHEN 5-325 MG PO TABS: 1.0000 | ORAL_TABLET | ORAL | Status: DC | PRN

## 2013-09-11 MED ORDER — FENTANYL CITRATE 0.05 MG/ML IJ SOLN
INTRAMUSCULAR | Status: AC
  Filled 2013-09-11: qty 2

## 2013-09-11 MED ORDER — MORPHINE SULFATE 4 MG/ML IJ SOLN
INTRAMUSCULAR | Status: DC | PRN
  Administered 2013-09-11: 4 mg via INTRAVENOUS

## 2013-09-11 MED ORDER — ROPIVACAINE HCL 5 MG/ML IJ SOLN
INTRAMUSCULAR | Status: DC | PRN
  Administered 2013-09-11: 20 mL via PERINEURAL

## 2013-09-11 MED ORDER — OXYCODONE HCL 5 MG/5ML PO SOLN
5.0000 mg | Freq: Once | ORAL | Status: DC | PRN
  Filled 2013-09-11: qty 5

## 2013-09-11 MED ORDER — ASPIRIN EC 325 MG PO TBEC: 325.0000 mg | DELAYED_RELEASE_TABLET | Freq: Two times a day (BID) | ORAL | Status: DC

## 2013-09-11 MED ORDER — MIDAZOLAM HCL 5 MG/5ML IJ SOLN
INTRAMUSCULAR | Status: DC | PRN
  Administered 2013-09-11 (×2): 2 mg via INTRAVENOUS

## 2013-09-11 MED ORDER — LACTATED RINGERS IV SOLN
INTRAVENOUS | Status: DC
  Filled 2013-09-11: qty 1000

## 2013-09-11 MED ORDER — LIDOCAINE HCL (CARDIAC) 20 MG/ML IV SOLN
INTRAVENOUS | Status: DC | PRN
  Administered 2013-09-11: 60 mg via INTRAVENOUS

## 2013-09-11 MED ORDER — BUPIVACAINE HCL (PF) 0.25 % IJ SOLN
INTRAMUSCULAR | Status: DC | PRN
  Administered 2013-09-11: 30 mL

## 2013-09-11 MED ORDER — FENTANYL CITRATE 0.05 MG/ML IJ SOLN
INTRAMUSCULAR | Status: AC
  Filled 2013-09-11: qty 6

## 2013-09-11 MED ORDER — LACTATED RINGERS IV SOLN
INTRAVENOUS | Status: DC
  Administered 2013-09-11 (×2): via INTRAVENOUS
  Filled 2013-09-11: qty 1000

## 2013-09-11 MED ORDER — LACTATED RINGERS IR SOLN
Status: DC | PRN
  Administered 2013-09-11: 18000 mL

## 2013-09-11 MED ORDER — MORPHINE SULFATE 4 MG/ML IJ SOLN
INTRAMUSCULAR | Status: AC
  Filled 2013-09-11: qty 1

## 2013-09-11 MED ORDER — POVIDONE-IODINE 7.5 % EX SOLN
Freq: Once | CUTANEOUS | Status: DC
  Filled 2013-09-11: qty 118

## 2013-09-11 MED ORDER — DEXAMETHASONE SODIUM PHOSPHATE 4 MG/ML IJ SOLN
INTRAMUSCULAR | Status: DC | PRN
  Administered 2013-09-11: 10 mg via INTRAVENOUS

## 2013-09-11 MED ORDER — MIDAZOLAM HCL 2 MG/2ML IJ SOLN
INTRAMUSCULAR | Status: AC
  Filled 2013-09-11: qty 4

## 2013-09-11 SURGICAL SUPPLY — 88 items
ANCHOR BUTTON TIGHTROPE BTB (Anchor) ×2 IMPLANT
ANCHOR BUTTON TIGHTROPE RN 14 (Anchor) ×2 IMPLANT
ANCHOR PUSHLOCK BIOCOMP 3.5X19 (Orthopedic Implant) ×2 IMPLANT
ARTHREX RETROCUTTER ×2 IMPLANT
BANDAGE ESMARK 6X9 LF (GAUZE/BANDAGES/DRESSINGS) ×1 IMPLANT
BLADE 4.2CUDA (BLADE) IMPLANT
BLADE CUDA 5.5 (BLADE) ×2 IMPLANT
BLADE CUDA GRT WHITE 3.5 (BLADE) IMPLANT
BLADE SURG 10 STRL SS (BLADE) ×2 IMPLANT
BLADE SURG 15 STRL LF DISP TIS (BLADE) ×1 IMPLANT
BLADE SURG 15 STRL SS (BLADE) ×1
BNDG ESMARK 6X9 LF (GAUZE/BANDAGES/DRESSINGS) ×2
BNDG GAUZE ELAST 4 BULKY (GAUZE/BANDAGES/DRESSINGS) ×4 IMPLANT
BUR OVAL 6.0 (BURR) IMPLANT
BUR VERTEX HOODED 4.5 (BURR) ×2 IMPLANT
CANISTER SUCT LVC 12 LTR MEDI- (MISCELLANEOUS) ×4 IMPLANT
CANISTER SUCTION 1200CC (MISCELLANEOUS) ×2 IMPLANT
CANISTER SUCTION 2500CC (MISCELLANEOUS) ×4 IMPLANT
CLOTH BEACON ORANGE TIMEOUT ST (SAFETY) ×2 IMPLANT
COVER TABLE BACK 60X90 (DRAPES) ×2 IMPLANT
CUTTER RETRO 9.0MM (CUTTER) ×2 IMPLANT
DRAPE ARTHROSCOPY W/POUCH 114 (DRAPES) ×2 IMPLANT
DRAPE INCISE IOBAN 66X45 STRL (DRAPES) ×2 IMPLANT
DRAPE LG THREE QUARTER DISP (DRAPES) ×4 IMPLANT
DRAPE OEC MINIVIEW 54X84 (DRAPES) ×2 IMPLANT
DRAPE U-SHAPE 47X51 STRL (DRAPES) ×2 IMPLANT
DRILL FLIPCUTTER II 9.0MM (INSTRUMENTS) ×1 IMPLANT
DRSG PAD ABDOMINAL 8X10 ST (GAUZE/BANDAGES/DRESSINGS) ×2 IMPLANT
DURAPREP 26ML APPLICATOR (WOUND CARE) ×2 IMPLANT
ELECT REM PT RETURN 9FT ADLT (ELECTROSURGICAL) ×2
ELECTRODE REM PT RTRN 9FT ADLT (ELECTROSURGICAL) ×1 IMPLANT
FIBERSTICK 2 (SUTURE) ×2 IMPLANT
FLIPCUTTER II 9.0MM (INSTRUMENTS) ×2
GAUZE XEROFORM 1X8 LF (GAUZE/BANDAGES/DRESSINGS) ×2 IMPLANT
GLOVE BIO SURGEON STRL SZ7.5 (GLOVE) ×2 IMPLANT
GLOVE BIOGEL M 6.5 STRL (GLOVE) ×2 IMPLANT
GLOVE BIOGEL PI IND STRL 6.5 (GLOVE) ×4 IMPLANT
GLOVE BIOGEL PI IND STRL 7.5 (GLOVE) ×1 IMPLANT
GLOVE BIOGEL PI INDICATOR 6.5 (GLOVE) ×4
GLOVE BIOGEL PI INDICATOR 7.5 (GLOVE) ×1
GLOVE INDICATOR 8.0 STRL GRN (GLOVE) ×2 IMPLANT
GLOVE SURG ORTHO 8.0 STRL STRW (GLOVE) ×2 IMPLANT
GOWN PREVENTION PLUS LG XLONG (DISPOSABLE) IMPLANT
GOWN STRL REIN XL XLG (GOWN DISPOSABLE) IMPLANT
GOWN STRL REUS W/TWL LRG LVL3 (GOWN DISPOSABLE) ×4 IMPLANT
GOWN STRL REUS W/TWL XL LVL3 (GOWN DISPOSABLE) ×4 IMPLANT
GUIDE PIN REFRO (PIN) ×2 IMPLANT
IMMOBILIZER KNEE 22 UNIV (SOFTGOODS) ×2 IMPLANT
IV LACTATED RINGER IRRG 3000ML (IV SOLUTION) ×6
IV LR IRRIG 3000ML ARTHROMATIC (IV SOLUTION) ×6 IMPLANT
IV NS IRRIG 3000ML ARTHROMATIC (IV SOLUTION) IMPLANT
KIT IMPLANT TIGHTROPE ABS OPEN (Anchor) ×2 IMPLANT
KIT TRANSTIBIAL (DISPOSABLE) ×2 IMPLANT
KNEE WRAP E Z 3 GEL PACK (MISCELLANEOUS) ×2 IMPLANT
MINI VAC (SURGICAL WAND) IMPLANT
NEEDLE HYPO 22GX1.5 SAFETY (NEEDLE) IMPLANT
PACK ARTHROSCOPY DSU (CUSTOM PROCEDURE TRAY) ×2 IMPLANT
PACK BASIN DAY SURGERY FS (CUSTOM PROCEDURE TRAY) ×2 IMPLANT
PAD ABD 8X10 STRL (GAUZE/BANDAGES/DRESSINGS) ×4 IMPLANT
PADDING CAST ABS 4INX4YD NS (CAST SUPPLIES) ×1
PADDING CAST ABS COTTON 4X4 ST (CAST SUPPLIES) ×1 IMPLANT
PENCIL BUTTON HOLSTER BLD 10FT (ELECTRODE) ×2 IMPLANT
SET ARTHROSCOPY TUBING (MISCELLANEOUS) ×1
SET ARTHROSCOPY TUBING LN (MISCELLANEOUS) ×1 IMPLANT
SET PAD KNEE POSITIONER (MISCELLANEOUS) ×4 IMPLANT
SPONGE GAUZE 4X4 12PLY (GAUZE/BANDAGES/DRESSINGS) ×4 IMPLANT
SPONGE GAUZE 4X4 12PLY STER LF (GAUZE/BANDAGES/DRESSINGS) ×2 IMPLANT
SPONGE LAP 4X18 X RAY DECT (DISPOSABLE) ×4 IMPLANT
STRIP CLOSURE SKIN 1/2X4 (GAUZE/BANDAGES/DRESSINGS) ×2 IMPLANT
SUCTION FRAZIER TIP 10 FR DISP (SUCTIONS) ×2 IMPLANT
SUT 2 FIBERLOOP 20 STRT BLUE (SUTURE) ×4
SUT ETHILON 4 0 PS 2 18 (SUTURE) ×2 IMPLANT
SUT FIBERWIRE #2 38 T-5 BLUE (SUTURE)
SUT MNCRL AB 3-0 PS2 18 (SUTURE) ×2 IMPLANT
SUT VIC AB 0 CT1 36 (SUTURE) ×4 IMPLANT
SUT VIC AB 0 CT2 27 (SUTURE) IMPLANT
SUT VIC AB 2-0 CT1 27 (SUTURE) ×1
SUT VIC AB 2-0 CT1 TAPERPNT 27 (SUTURE) ×1 IMPLANT
SUTURE 2 FIBERLOOP 20 STRT BLU (SUTURE) ×2 IMPLANT
SUTURE FIBERWR #2 38 T-5 BLUE (SUTURE) IMPLANT
SUTURE TIGERSTICK 2 TIGERWIR 2 (MISCELLANEOUS) ×1 IMPLANT
SYR CONTROL 10ML LL (SYRINGE) IMPLANT
TIGERSTICK 2 TIGERWIRE 2 (MISCELLANEOUS) ×2
TISSUE GRAFTLINK FGL (Tissue) ×2 IMPLANT
TOWEL OR 17X24 6PK STRL BLUE (TOWEL DISPOSABLE) ×4 IMPLANT
WAND 30 DEG SABER W/CORD (SURGICAL WAND) IMPLANT
WAND 90 DEG TURBOVAC W/CORD (SURGICAL WAND) IMPLANT
WATER STERILE IRR 500ML POUR (IV SOLUTION) ×2 IMPLANT

## 2013-09-11 NOTE — H&P (Signed)
Kathy RothmanMelinda C [name] is an 48 y.o. female.   Chief Complaint: Left knee pain HPI: Patient presents with Left pain and instability.This is a result from a injury several weeks ago. Despite conservative treatments she still reports discomfort. Imaging confirmed ALC tear. Treatment options were discussed in detail, the patients wishes to proceed with surgery as consented. Denies SOB, CP, or calf pain. No F/C/N/V.  Past Medical History  Diagnosis Date  . Acute meniscal tear of left knee   . ACL (anterior cruciate ligament) tear   . OA (osteoarthritis) of knee     LEFT    Past Surgical History  Procedure Laterality Date  . Tubal ligation  2007  . Tonsillectomy  1996  . Closed reduction nasal fracture N/A 07/29/2013    Procedure:  OPEN REPAIR OF SEPTAL NASAL FRACTURE;  Surgeon: Wayland Denislaire [name], DO;  Location: [facility];  Service: Plastics;  Laterality: N/A;    History reviewed. No pertinent family history. Social History:  reports that she has never smoked. She has never used smokeless tobacco. She reports that she drinks alcohol. She reports that she does not use illicit drugs.  Allergies: No Known Allergies  Medications Prior to Admission  Medication Sig Dispense Refill  . PARoxetine (PAXIL) 10 MG tablet Take 10 mg by mouth daily.       Marland Kitchen. triamterene-hydrochlorothiazide (MAXZIDE-25) 37.5-25 MG per tablet Take 1 tablet by mouth daily.       Marland Kitchen. HYDROcodone-acetaminophen (NORCO/VICODIN) 5-325 MG per tablet Take 1-2 tablets by mouth every 6 (six) hours as needed.         Results for orders placed during the hospital encounter of 09/11/13 (from the past 48 hour(s))  POCT I-STAT 4, (NA,K, GLUC, HGB,HCT)     Status: Abnormal   Collection Time    09/11/13 12:05 PM      Result Value Ref Range   Sodium 139  137 - 147 mEq/L   Potassium 3.6 (*) 3.7 - 5.3 mEq/L   Glucose, Bld 99  70 - 99 mg/dL   HCT 16.137.0  09.636.0 - 04.546.0 %   Hemoglobin 12.6  12.0 - 15.0 g/dL   No results  found.  Review of Systems  Constitutional: Negative.   HENT: Negative.   Eyes: Negative.   Respiratory: Negative.   Cardiovascular: Negative.   Gastrointestinal: Negative.   Genitourinary: Negative.   Musculoskeletal: Positive for joint pain.  Skin: Negative.   Neurological: Negative.   Endo/Heme/Allergies: Negative.   Psychiatric/Behavioral: Negative.     Blood pressure 110/73, pulse 65, temperature 96.7 F (35.9 C), temperature source Oral, resp. rate 16, height 5\' 3"  (1.6 m), weight 71.215 kg (157 lb), last menstrual period 09/02/2013, SpO2 99.00%. Physical Exam  Constitutional: She is oriented to person, place, and time. She appears well-developed.  HENT:  Head: Normocephalic.  Eyes: EOM are normal.  Cardiovascular: Normal rate, regular rhythm, normal heart sounds and intact distal pulses.   Respiratory: Effort normal and breath sounds normal.  GI: Soft. Bowel sounds are normal.  Genitourinary:  Deferred  Musculoskeletal: She exhibits edema and tenderness.  Left knee  Neurological: She is alert and oriented to person, place, and time.  Skin: Skin is dry.  Psychiatric: Her behavior is normal.     Assessment/Plan Left knee ACL tear and torn meniscus: [facility] for ACL reconstruction and menisectomy D/C home Follow D/c instructions Take medications as directed Follow up in the office in 3 days  Lateef Juncaj L 09/11/2013, 1:11 PM

## 2013-09-11 NOTE — Transfer of Care (Signed)
Immediate Anesthesia Transfer of Care Note  Patient: Nelma RothmanMelinda C Deady  Procedure(s) Performed: Procedure(s) (LRB): LEFT KNEE ARTHROSCOPY WITH ALLOGRAFT ANTERIOR CRUCIATE LIGAMENT (ACL) RECONSTRUCTION PARTIAL MENISCECTOMY  CHONDROPLASTY  (Left)  Patient Location: PACU  Anesthesia Type: General  Level of Consciousness: awake, alert  and oriented  Airway & Oxygen Therapy: Patient Spontanous Breathing and Patient connected to face mask oxygen  Post-op Assessment: Report given to PACU RN and Post -op Vital signs reviewed and stable  Post vital signs: Reviewed and stable  Complications: No apparent anesthesia complications

## 2013-09-11 NOTE — Op Note (Signed)
Dictated [id number]

## 2013-09-11 NOTE — Anesthesia Procedure Notes (Addendum)
Anesthesia Regional Block:  Femoral nerve block  Pre-Anesthetic Checklist: ,, timeout performed, Correct Patient, Correct Site, Correct Laterality, Correct Procedure, Correct Position, site marked, Risks and benefits discussed,  Surgical consent,  Pre-op evaluation,  At surgeon's request and post-op pain management  Laterality: Left  Prep: chloraprep       Needles:  Injection technique: Single-shot  Needle Type: Stimulator Needle - 80     Needle Length: 10cm 10 cm Needle Gauge: 22 and 22 G  Needle insertion depth: 6 cm   Additional Needles:  Procedures: ultrasound guided (picture in chart) and nerve stimulator  Motor weakness within 5 minutes. Femoral nerve block  Nerve Stimulator or Paresthesia:  Response: Twitch elicited, 0.6 mA,   Additional Responses:   Narrative:  Start time: 09/11/2013 12:30 PM End time: 09/11/2013 12:40 PM Injection made incrementally with aspirations every 5 mL.  Performed by: Personally  Anesthesiologist: Renold DonGermeroth, MD  Additional Notes: BP cuff, EKG monitors applied. Sedation begun. Femoral artery palpated for location of nerve. After nerve location anesthetic injected incrementally, slowly , and after neg aspirations. Tolerated well.   Procedure Name: LMA Insertion Date/Time: 09/11/2013 1:43 PM Performed by: Norva PavlovALLAWAY, Arra Connaughton G Pre-anesthesia Checklist: Patient identified, Emergency Drugs available, Suction available and Patient being monitored Patient Re-evaluated:Patient Re-evaluated prior to inductionOxygen Delivery Method: Circle System Utilized Preoxygenation: Pre-oxygenation with 100% oxygen Intubation Type: IV induction Ventilation: Mask ventilation without difficulty LMA: LMA inserted LMA Size: 4.0 Number of attempts: 1 Airway Equipment and Method: bite block Placement Confirmation: positive ETCO2 Tube secured with: Tape Dental Injury: Teeth and Oropharynx as per pre-operative assessment

## 2013-09-11 NOTE — Anesthesia Postprocedure Evaluation (Signed)
Anesthesia Post Note  Patient: Kathy [name]  Procedure(s) Performed: Procedure(s) (LRB): LEFT KNEE ARTHROSCOPY WITH ALLOGRAFT ANTERIOR CRUCIATE LIGAMENT (ACL) RECONSTRUCTION PARTIAL MENISCECTOMY  CHONDROPLASTY  (Left)  Anesthesia type: General  Patient location: PACU  Post pain: Pain level controlled  Post assessment: Post-op Vital signs reviewed  Last Vitals: BP 107/70  Pulse 69  Temp(Src) 36 C (Oral)  Resp 16  Ht 5\' 3"  (1.6 m)  Wt 157 lb (71.215 kg)  BMI 27.82 kg/m2  SpO2 98%  LMP 09/02/2013  Post vital signs: Reviewed  Level of consciousness: sedated  Complications: No apparent anesthesia complications

## 2013-09-11 NOTE — Anesthesia Preprocedure Evaluation (Addendum)
Anesthesia Evaluation  Patient identified by MRN, date of birth, ID band Patient awake    Reviewed: Allergy & Precautions, H&P , NPO status , Patient's Chart, lab work & pertinent test results  Airway Mallampati: II TM Distance: >3 FB Neck ROM: full    Dental no notable dental hx. (+) Teeth Intact, Dental Advisory Given   Pulmonary neg pulmonary ROS,  breath sounds clear to auscultation  Pulmonary exam normal       Cardiovascular Exercise Tolerance: Good negative cardio ROS  Rhythm:regular Rate:Normal     Neuro/Psych negative neurological ROS  negative psych ROS   GI/Hepatic negative GI ROS, Neg liver ROS,   Endo/Other  negative endocrine ROS  Renal/GU negative Renal ROS  negative genitourinary   Musculoskeletal   Abdominal   Peds  Hematology negative hematology ROS (+)   Anesthesia Other Findings   Reproductive/Obstetrics negative OB ROS                           Anesthesia Physical Anesthesia Plan  ASA: I  Anesthesia Plan: General and Regional   Post-op Pain Management:    Induction: Intravenous  Airway Management Planned: LMA  Additional Equipment:   Intra-op Plan:   Post-operative Plan: Extubation in OR  Informed Consent: I have reviewed the patients History and Physical, chart, labs and discussed the procedure including the risks, benefits and alternatives for the proposed anesthesia with the patient or authorized representative who has indicated his/her understanding and acceptance.   Dental Advisory Given  Plan Discussed with: CRNA and Surgeon  Anesthesia Plan Comments:        Anesthesia Quick Evaluation

## 2013-09-11 NOTE — Discharge Instructions (Signed)
°  Post Anesthesia Home Care Instructions  Activity: Get plenty of rest for the remainder of the day. A responsible adult should stay with you for 24 hours following the procedure.  For the next 24 hours, DO NOT: -Drive a car -Advertising copywriterperate machinery -Drink alcoholic beverages -Take any medication unless instructed by your physician -Make any legal decisions or sign important papers.  Meals: Start with liquid foods such as gelatin or soup. Progress to regular foods as tolerated. Avoid greasy, spicy, heavy foods. If nausea and/or vomiting occur, drink only clear liquids until the nausea and/or vomiting subsides. Call your physician if vomiting continues.  Special Instructions/Symptoms: Your throat may feel dry or sore from the anesthesia or the breathing tube placed in your throat during surgery. If this causes discomfort, gargle with warm salt water. The discomfort should disappear within 24 hours.  Regional Anesthesia Blocks  1. Numbness or the inability to move the "blocked" extremity may last from 3-48 hours after placement. The length of time depends on the medication injected and your individual response to the medication. If the numbness is not going away after 48 hours, call your surgeon.  2. The extremity that is blocked will need to be protected until the numbness is gone and the  Strength has returned. Because you cannot feel it, you will need to take extra care to avoid injury. Because it may be weak, you may have difficulty moving it or using it. You may not know what position it is in without looking at it while the block is in effect.  3. For blocks in the legs and feet, returning to weight bearing and walking needs to be done carefully. You will need to wait until the numbness is entirely gone and the strength has returned. You should be able to move your leg and foot normally before you try and bear weight or walk. You will need someone to be with you when you first try to ensure you  do not fall and possibly risk injury.  4. Bruising and tenderness at the needle site are common side effects and will resolve in a few days.  5. Persistent numbness or new problems with movement should be communicated to the surgeon or the [facility] [phone])/ [facility] [phone]).    SYMPTOMS TO REPORT TO YOUR DOCTOR  -Extreme pain not relieved by your pain medicine              -Extreme swelling.  -Temperature above 101 degrees.  -Any change in the feeling, color or movement of your toes.    -Redness, heat, swelling or drainage from your incision sites..  You have a lot of pain in your leg when you move your foot up and down at your ankle.  There is pus or any unusual drainage coming from your incision sites.  You develop a fever.  You notice a bad smell coming from your incision sites.  Any of your incisions break open (edges do not stay together) after sutures or staples have been removed. MAKE SURE YOU:  Understand these instructions.  Will watch your condition.  Will get help right away if you are not doing well or get worse. Document Released: 01/26/2005 Document Revised: 04/02/2012 Document Reviewed: 12/02/2011 [facility] Patient Information 2014 [employer] [place] [place].

## 2013-09-11 NOTE — Interval H&P Note (Signed)
History and Physical Interval Note:  09/11/2013 1:35 PM  Kathy [name]  has presented today for surgery, with the diagnosis of LEFT KNEE ACL TEAR MENSICUS TEAR AND OA   The various methods of treatment have been discussed with the patient and family. After consideration of risks, benefits and other options for treatment, the patient has consented to  Procedure(s): LEFT KNEE ARTHROSCOPY WITH ALLOGRAFT ANTERIOR CRUCIATE LIGAMENT (ACL) RECONSTRUCTION PARTIAL MENISCECTOMY VS REPAIR AND CHONDROPLASTY  (Left) as a surgical intervention .  The patient's history has been reviewed, patient examined, no change in status, stable for surgery.  I have reviewed the patient's chart and labs.  Questions were answered to the patient's satisfaction.     [name] ANDREW   

## 2013-09-11 NOTE — H&P (View-Only) (Signed)
NPO AFTER MN WITH EXCEPTION CLEAR LIQUIDS UNTIL 0700 (NO CREAM/ PRODUCTS). ARRIVE AT 1100. NEEDS ISTAT. WILL TAKE PAXIL AND IF NEEDED HYDROCODONE AM DOS W/ SIPS OF WATER. 

## 2013-09-11 NOTE — Interval H&P Note (Signed)
History and Physical Interval Note:  09/11/2013 1:35 PM  Kathy [name]  has presented today for surgery, with the diagnosis of LEFT KNEE ACL TEAR MENSICUS TEAR AND OA   The various methods of treatment have been discussed with the patient and family. After consideration of risks, benefits and other options for treatment, the patient has consented to  Procedure(s): LEFT KNEE ARTHROSCOPY WITH ALLOGRAFT ANTERIOR CRUCIATE LIGAMENT (ACL) RECONSTRUCTION PARTIAL MENISCECTOMY VS REPAIR AND CHONDROPLASTY  (Left) as a surgical intervention .  The patient's history has been reviewed, patient examined, no change in status, stable for surgery.  I have reviewed the patient's chart and labs.  Questions were answered to the patient's satisfaction.     [name] ANDREW

## 2013-09-14 NOTE — Op Note (Signed)
[name], [name] NO.:  [id number]  MEDICAL RECORD NO.:  [id number]  LOCATION:                                 FACILITY:  PHYSICIAN:  Kathy [name], M.D.DATE OF BIRTH:  03-Jan-1966  DATE OF PROCEDURE: DATE OF DISCHARGE:                              OPERATIVE REPORT   PREOPERATIVE DIAGNOSES:  Left knee torn anterior cruciate ligament, sprain medial collateral ligament, possible torn meniscus.  POSTOPERATIVE DIAGNOSES: 1. Left knee torn anterior cruciate ligament. 2. Medial collateral ligament sprain, healing. 3. 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  PROCEDURE: 1. Left knee arthroscopic-assisted allograft, anterior cruciate     ligament reconstruction. 2. Chondroplasty of the patella.  SURGEON:  Kathy [name], M.D.  ASSISTANT:  Kathy Loader, PA-C.  ANESTHESIA:  Femoral nerve block with general.  ESTIMATED BLOOD LOSS:  Minimal.  COMPLICATIONS:  None.  TOURNIQUET TIME:  __________ minutes at 250 mmHg.  DISPOSITION:  PACU, stable.  OPERATIVE DETAILS:  The patient was counseled in the holding area. Correct site was identified, marked, and signed appropriately.  Block have been administered.  IV antibiotics were given on the way to the operating room.  TED hose __________ right.  __________ position under general anesthesia.  All extremities were well padded __________ was then elevated, prepped with DuraPrep and draped in sterile fashion. Time-out was done and confirmed by __________.  An Esmarch __________ inflated to 250 mmHg.  Arthroscopic portal was __________ proximal medial, inferomedial, inferolateral portals.  Diagnostic arthroscopy revealed the ACL to be strained significantly with __________ femoral attachment.  __________.  PCL was protected __________ standard fashion. __________ was confirmed.  __________ was normal.  There was grade 2 chondromalacia patella __________.  Lateral side was inspected. __________  posterior horn which was nicely stable.  Medial side was inspected __________ as well as the medial meniscus.  __________ correct position.  Small stab wound was made __________ anatomic ACL footprint in the femoral intercondylar notch __________ socket was performed and FiberWire suture was passed __________ on tibial side, the Retro cutter was placed in the ACL footprint.  Small incision was made into the tibia.  A __________ socket on the tibia.  This was then removed.  Both tunnels __________ nicely and smoothly and a FiberWire suture was placed __________ size appropriate graft prepared with ACL TightRope at each side.  The femoral side between the femoral socket and then the tibial side __________ down nicely confirmed with the C-arm __________.  The tibia side was __________ on the medial side.  __________ range of motion __________ on both sides.  Excellent orientation __________ excellent position __________ PushLock anchor.  __________ Sensorcaine __________ morphine sulfate into the knee joint.  __________ applied TED hose, ice pack, and knee __________ in full extension.  She tolerated the procedure well.  There were no complications or problems. __________.  She was awakened, taken to from operating room to PACU in stable condition.  She will be stabilized in PACU and discharged home.  To help with the patient positioning, prepping, draping, technical or surgical assistant throughout the entire case, wound closure, application, dressing, knee immobilizer, Mr. Kathy Loader, PA-C, assistance was needed.  ______________________________ Kathy Leventhalobert Andrew Draco Malczewski, M.D.     RAC/MEDQ  D:  09/11/2013  T:  09/12/2013  Job:  [id number]

## 2013-09-17 ENCOUNTER — Encounter (HOSPITAL_BASED_OUTPATIENT_CLINIC_OR_DEPARTMENT_OTHER): Payer: Self-pay | Admitting: Specialist

## 2013-09-18 DIAGNOSIS — Z9889 Other specified postprocedural states: Secondary | ICD-10-CM | POA: Insufficient documentation

## 2013-09-18 DIAGNOSIS — L905 Scar conditions and fibrosis of skin: Secondary | ICD-10-CM | POA: Insufficient documentation

## 2014-05-27 ENCOUNTER — Encounter: Payer: Self-pay | Admitting: Podiatry

## 2014-05-27 ENCOUNTER — Ambulatory Visit (INDEPENDENT_AMBULATORY_CARE_PROVIDER_SITE_OTHER): Payer: 59 | Admitting: Podiatry

## 2014-05-27 VITALS — BP 120/76 | HR 68 | Resp 16

## 2014-05-27 DIAGNOSIS — L600 Ingrowing nail: Secondary | ICD-10-CM

## 2014-05-27 NOTE — Patient Instructions (Signed)

## 2014-05-27 NOTE — Progress Notes (Signed)
   Subjective:    Patient ID: Kathy RothmanMelinda C [name], female    DOB: 03/04/1966, 48 y.o.   MRN: [id number]  HPI Comments: "I have pain in this toe"  Patient c/o tenderness medial 1st toe left for few weeks. She has had an ingrown procedure on that border before. Slightly red. Soaking warm salt water.      Review of Systems  All other systems reviewed and are negative.      Objective:   Physical Exam        Assessment & Plan:

## 2014-05-27 NOTE — Progress Notes (Signed)
Subjective:     Patient ID: Kathy [name], female   DOB: 06/18/1966, 48 y.o.   MRN: [id number]  HPIpatient states I got a painful ingrown toenail on my left big toe that I have not been able to take care of myself and it seems like it's getting thicker   Review of Systems  All other systems reviewed and are negative.      Objective:   Physical Exam  Constitutional: She is oriented to person, place, and time.  Cardiovascular: Intact distal pulses.   Musculoskeletal: Normal range of motion.  Neurological: She is oriented to person, place, and time.  Skin: Skin is warm.  Nursing note and vitals reviewed. neurovascular status intact with muscle strength adequate and range of motion subtalar and midtarsal joint within normal limits. Patient is noted to have good digital is well oriented 3 and has an incurvated left hallux nail and he'll border that is painful when pressed     Assessment:     Ingrown toenail deformity left hallux medial border with pain    Plan:     H&P performed and recommended removal of the corner. Explained procedure and risk and patient wants surgery and today I infiltrated 60 mg Xylocaine Marcaine mixture removed the medial corner exposed matrix and applied phenol 3 applications 30 seconds followed by alcohol lavaged and sterile dressing. Instructed on soaks and reappoint

## 2014-06-02 ENCOUNTER — Other Ambulatory Visit: Payer: Self-pay | Admitting: Obstetrics and Gynecology

## 2014-06-03 LAB — CYTOLOGY - PAP

## 2014-10-11 ENCOUNTER — Other Ambulatory Visit: Payer: Self-pay | Admitting: Dermatology

## 2017-08-15 ENCOUNTER — Other Ambulatory Visit: Payer: Self-pay | Admitting: Obstetrics and Gynecology

## 2017-08-15 DIAGNOSIS — R928 Other abnormal and inconclusive findings on diagnostic imaging of breast: Secondary | ICD-10-CM

## 2017-08-21 ENCOUNTER — Ambulatory Visit
Admission: RE | Admit: 2017-08-21 | Discharge: 2017-08-21 | Disposition: A | Discharge status: Home / Self Care | Payer: 59 | Source: Ambulatory Visit | Attending: Obstetrics and Gynecology | Admitting: Obstetrics and Gynecology

## 2017-08-21 DIAGNOSIS — R928 Other abnormal and inconclusive findings on diagnostic imaging of breast: Secondary | ICD-10-CM

## 2017-09-23 ENCOUNTER — Encounter: Payer: Self-pay | Admitting: *Deleted

## 2017-09-26 ENCOUNTER — Ambulatory Visit: Payer: 59 | Admitting: Cardiology

## 2017-09-26 ENCOUNTER — Encounter: Payer: Self-pay | Admitting: Cardiology

## 2017-09-26 VITALS — BP 124/82 | HR 71 | Ht 63.0 in | Wt 157.0 lb

## 2017-09-26 DIAGNOSIS — Z8249 Family history of ischemic heart disease and other diseases of the circulatory system: Secondary | ICD-10-CM | POA: Diagnosis not present

## 2017-09-26 NOTE — Progress Notes (Deleted)
Cardiology Office Note:    Date:  09/26/2017   ID:  Kathy [name], DOB 02/22/1966, MRN [id number]  PCP:  Richardean ChimeraMcComb, John, MD  Cardiologist:  No primary care provider on file.    Referring MD: Richardean ChimeraMcComb, John, MD   No chief complaint on file.   History of Present Illness:    Kathy [name] is a 52 y.o. female with a hx of ***  Past Medical History:  Diagnosis Date  . ACL (anterior cruciate ligament) tear   . Acute meniscal tear of left knee   . OA (osteoarthritis) of knee    LEFT    Past Surgical History:  Procedure Laterality Date  . BREAST BIOPSY    . CLOSED REDUCTION NASAL FRACTURE N/A 07/29/2013   Procedure:  OPEN REPAIR OF SEPTAL NASAL FRACTURE;  Surgeon: Wayland Denislaire [name], DO;  Location: [facility];  Service: Plastics;  Laterality: N/A;  . KNEE ARTHROSCOPY WITH ANTERIOR CRUCIATE LIGAMENT (ACL) REPAIR WITH HAMSTRING GRAFT Left 09/11/2013   Procedure: LEFT KNEE ARTHROSCOPY WITH ALLOGRAFT ANTERIOR CRUCIATE LIGAMENT (ACL) RECONSTRUCTION PARTIAL MENISCECTOMY  CHONDROPLASTY ;  Surgeon: Eugenia Mcalpineobert Collins, MD;  [facility];  Service: Orthopedics;  Laterality: Left;  . TONSILLECTOMY  1996  . TUBAL LIGATION  2007    Current Medications: No outpatient medications have been marked as taking for the 09/26/17 encounter (Appointment) with Quintella Reicherturner, Tristram Milian R, MD.     Allergies:   Patient has no known allergies.   Social History   Socioeconomic History  . Marital status: Married    Spouse name: Not on file  . Number of children: Not on file  . Years of education: Not on file  . Highest education level: Not on file  Social Needs  . Financial resource strain: Not on file  . Food insecurity - worry: Not on file  . Food insecurity - inability: Not on file  . Transportation needs - medical: Not on file  . Transportation needs - non-medical: Not on file  Occupational History  . Not on file  Tobacco Use  . Smoking status: Never Smoker  . Smokeless  tobacco: Never Used  Substance and Sexual Activity  . Alcohol use: Yes    Comment: OCCASIONAL  . Drug use: No  . Sexual activity: Not on file  Other Topics Concern  . Not on file  Social History Narrative  . Not on file     Family History: The patient's ***family history includes Cancer in her father; Heart failure in her maternal grandfather, maternal grandmother, paternal grandfather, and paternal grandmother.  ROS:   Please see the history of present illness.    ROS  All other systems reviewed and negative.   EKGs/Labs/Other Studies Reviewed:    The following studies were reviewed today: ***  EKG:  EKG is *** ordered today.  The ekg ordered today demonstrates ***  Recent Labs: No results found for requested labs within last 8760 hours.   Recent Lipid Panel No results found for: CHOL, TRIG, HDL, CHOLHDL, VLDL, LDLCALC, LDLDIRECT  Physical Exam:    VS:  There were no vitals taken for this visit.    Wt Readings from Last 3 Encounters:  09/11/13 157 lb (71.2 kg)  07/29/13 158 lb (71.7 kg)     GEN: *** Well nourished, well developed in no acute distress HEENT: Normal NECK: No JVD; No carotid bruits LYMPHATICS: No lymphadenopathy CARDIAC: ***RRR, no murmurs, rubs, gallops RESPIRATORY:  Clear to auscultation without rales, wheezing or  rhonchi  ABDOMEN: Soft, non-tender, non-distended MUSCULOSKELETAL:  No edema; No deformity  SKIN: Warm and dry NEUROLOGIC:  Alert and oriented x 3 PSYCHIATRIC:  Normal affect   ASSESSMENT:    No diagnosis found. PLAN:    In order of problems listed above:  ***   Medication Adjustments/Labs and Tests Ordered: Current medicines are reviewed at length with the patient today.  Concerns regarding medicines are outlined above.  No orders of the defined types were placed in this encounter.  No orders of the defined types were placed in this encounter.   Signed, Armanda Magic, MD  09/26/2017 6:58 AM    [facility]

## 2017-09-26 NOTE — Patient Instructions (Signed)
Medication Instructions:  Your physician recommends that you continue on your current medications as directed. Please refer to the Current Medication list given to you today.  If you need a refill on your cardiac medications, please contact your pharmacy first.  Labwork: None ordered   Testing/Procedures: Your physician has requested that you have a stress echocardiogram. For further information please visit [email or web address] Please follow instruction sheet as given.  Your physician has requested that you have cardiac CT. Cardiac computed tomography (CT) is a painless test that uses an x-ray machine to take clear, detailed pictures of your heart. For further information please visit [email or web address]. Please follow instruction sheet as given.  Follow-Up: Your physician wants you to follow-up as needed with Dr. Mayford Knifeurner   Any Other Special Instructions Will Be Listed Below (If Applicable).   [facility]    Lyda PeroneRena Felton Buczynski, RN  [phone]  If you need a refill on your cardiac medications before your next appointment, please call your pharmacy.

## 2017-09-26 NOTE — Progress Notes (Signed)
Cardiology Office Note    Date:  09/26/2017   ID:  Kathy [name], DOB 22-Jul-1966, MRN [id number]  PCP:  Richardean Chimera, MD  Cardiologist:  Armanda Magic, MD   Chief Complaint  Patient presents with  . New Patient (Initial Visit)    family history of CHF    History of Present Illness:  Kathy [name] is a 52 y.o. female who is being seen today for the evaluation of family history of CAD at the request of McComb, John, MD.  This is a 52 year old female with no significant past medical history who was referred here for evaluation because she has a strong family history of CAD on both her mother and father side of the family.  Her father had an MI in his 28's and her brother had an MI at 59.  She denies any chest pain or pressure, SOB, DOE, PND, orthopnea, LE edema, dizziness, palpitations or syncope. She is compliant with her meds and is tolerating meds with no SE.     Past Medical History:  Diagnosis Date  . ACL (anterior cruciate ligament) tear   . Acute meniscal tear of left knee   . OA (osteoarthritis) of knee    LEFT    Past Surgical History:  Procedure Laterality Date  . BREAST BIOPSY    . CLOSED REDUCTION NASAL FRACTURE N/A 07/29/2013   Procedure:  OPEN REPAIR OF SEPTAL NASAL FRACTURE;  Surgeon: Wayland Denis, DO;  Location: [facility];  Service: Plastics;  Laterality: N/A;  . KNEE ARTHROSCOPY WITH ANTERIOR CRUCIATE LIGAMENT (ACL) REPAIR WITH HAMSTRING GRAFT Left 09/11/2013   Procedure: LEFT KNEE ARTHROSCOPY WITH ALLOGRAFT ANTERIOR CRUCIATE LIGAMENT (ACL) RECONSTRUCTION PARTIAL MENISCECTOMY  CHONDROPLASTY ;  Surgeon: Eugenia Mcalpine, MD;  Location: [facility];  Service: Orthopedics;  Laterality: Left;  . TONSILLECTOMY  1996  . TUBAL LIGATION  2007    Current Medications: Current Meds  Medication Sig  . triamterene-hydrochlorothiazide (MAXZIDE-25) 37.5-25 MG per tablet Take 1 tablet by mouth daily.   Marland Kitchen zolpidem (AMBIEN) 10 MG tablet TK 1 T  PO HS PRF INSOMNIA    Allergies:   Patient has no known allergies.   Social History   Socioeconomic History  . Marital status: Married    Spouse name: None  . Number of children: None  . Years of education: None  . Highest education level: None  Social Needs  . Financial resource strain: None  . Food insecurity - worry: None  . Food insecurity - inability: None  . Transportation needs - medical: None  . Transportation needs - non-medical: None  Occupational History  . None  Tobacco Use  . Smoking status: Never Smoker  . Smokeless tobacco: Never Used  Substance and Sexual Activity  . Alcohol use: Yes    Comment: OCCASIONAL  . Drug use: No  . Sexual activity: None  Other Topics Concern  . None  Social History Narrative  . None     Family History:  The patient's family history includes Cancer in her father; Heart attack (age of onset: 102) in her brother; Heart block in her father; Heart disease (age of onset: 67) in her father; Heart failure in her maternal grandfather, maternal grandmother, paternal grandfather, and paternal grandmother.   ROS:   Please see the history of present illness.    ROS All other systems reviewed and are negative.  No flowsheet data found.   PHYSICAL EXAM:   VS:  BP 124/82 (BP  Location: Left Arm, Patient Position: Sitting, Cuff Size: Normal)   Pulse 71   Ht 5\' 3"  (1.6 m)   Wt 157 lb (71.2 kg)   SpO2 97%   BMI 27.81 kg/m    GEN: Well nourished, well developed, in no acute distress  HEENT: normal  Neck: no JVD, carotid bruits, or masses Cardiac: RRR; no murmurs, rubs, or gallops,no edema.  Intact distal pulses bilaterally.  Respiratory:  clear to auscultation bilaterally, normal work of breathing GI: soft, nontender, nondistended, + BS MS: no deformity or atrophy  Skin: warm and dry, no rash Neuro:  Alert and Oriented x 3, Strength and sensation are intact Psych: euthymic mood, full affect  Wt Readings from Last 3 Encounters:    09/26/17 157 lb (71.2 kg)  09/11/13 157 lb (71.2 kg)  07/29/13 158 lb (71.7 kg)      Studies/Labs Reviewed:   EKG:  EKG is ordered today.  The ekg ordered today demonstrates NSR at 61bpm with no ST changes  Recent Labs: No results found for requested labs within last 8760 hours.   Lipid Panel No results found for: CHOL, TRIG, HDL, CHOLHDL, VLDL, LDLCALC, LDLDIRECT  Additional studies/ records that were reviewed today include:  none    ASSESSMENT:    1. Family history of early CAD      PLAN:  In order of problems listed above:  1.  Family history of premature CAD - she has a history of early CAD on both her maternal and paternal sides of the family. Her father had an MI at 4850 and her brother had an MI at 5344.   She was exposed to second had smoke as a child.  Her EKG is nonischemic and she is asymptomatic and does Pilates and Pure Barr without any problems.  I will get a stress echo to rule out ischemia and coronary calcium score to assess future risk.  .    Medication Adjustments/Labs and Tests Ordered: Current medicines are reviewed at length with the patient today.  Concerns regarding medicines are outlined above.  Medication changes, Labs and Tests ordered today are listed in the Patient Instructions below.  There are no Patient Instructions on file for this visit.   Signed, Armanda Magicraci Turner, MD  09/26/2017 10:49 AM    [facility] [address], [place], [place]  [address] Phone: [phone]; Fax: [phone]

## 2017-10-10 ENCOUNTER — Telehealth (HOSPITAL_COMMUNITY): Payer: Self-pay | Admitting: *Deleted

## 2017-10-10 NOTE — Telephone Encounter (Signed)
Left message on voicemail per DPR in reference to upcoming appointment scheduled on 10/15/17 at 2:30 with detailed instructions given per Stress Test Requisition Sheet for the test. LM to arrive 30 minutes early, and that it is imperative to arrive on time for appointment to keep from having the test rescheduled. If you need to cancel or reschedule your appointment, please call the office within 24 hours of your appointment. Failure to do so may result in a cancellation of your appointment, and a $50 no show fee. Phone number given for call back for any questions. Daneil DolinSharon S Brooks

## 2017-10-15 ENCOUNTER — Ambulatory Visit (HOSPITAL_COMMUNITY): Payer: 59 | Attending: Cardiovascular Disease

## 2017-10-15 ENCOUNTER — Other Ambulatory Visit: Payer: Self-pay

## 2017-10-15 ENCOUNTER — Ambulatory Visit (INDEPENDENT_AMBULATORY_CARE_PROVIDER_SITE_OTHER)
Admission: RE | Admit: 2017-10-15 | Discharge: 2017-10-15 | Disposition: A | Discharge status: Home / Self Care | Payer: 59 | Source: Ambulatory Visit | Attending: Cardiology | Admitting: Cardiology

## 2017-10-15 ENCOUNTER — Ambulatory Visit (HOSPITAL_COMMUNITY): Payer: 59

## 2017-10-15 DIAGNOSIS — R5383 Other fatigue: Secondary | ICD-10-CM | POA: Insufficient documentation

## 2017-10-15 DIAGNOSIS — R0600 Dyspnea, unspecified: Secondary | ICD-10-CM | POA: Diagnosis not present

## 2017-10-15 DIAGNOSIS — Z8249 Family history of ischemic heart disease and other diseases of the circulatory system: Secondary | ICD-10-CM

## 2017-10-16 ENCOUNTER — Telehealth: Payer: Self-pay | Admitting: Cardiology

## 2017-10-16 NOTE — Telephone Encounter (Signed)
New message  Pt verbalized that she is returning call for RN  For CT results

## 2017-10-16 NOTE — Telephone Encounter (Signed)
Pt is aware of echo is normal and ct calcium score is "0" is normal. Pt verbalized understanding.

## 2018-06-10 DIAGNOSIS — M25562 Pain in left knee: Secondary | ICD-10-CM | POA: Insufficient documentation

## 2019-06-09 ENCOUNTER — Other Ambulatory Visit: Payer: Self-pay

## 2019-06-09 DIAGNOSIS — Z20822 Contact with and (suspected) exposure to covid-19: Secondary | ICD-10-CM

## 2019-06-11 LAB — NOVEL CORONAVIRUS, NAA: SARS-CoV-2, NAA: NOT DETECTED

## 2019-08-24 ENCOUNTER — Ambulatory Visit: Payer: 59 | Attending: Internal Medicine

## 2019-08-24 DIAGNOSIS — Z20822 Contact with and (suspected) exposure to covid-19: Secondary | ICD-10-CM

## 2019-08-25 LAB — NOVEL CORONAVIRUS, NAA: SARS-CoV-2, NAA: NOT DETECTED

## 2019-09-11 ENCOUNTER — Ambulatory Visit: Payer: 59

## 2019-09-14 ENCOUNTER — Ambulatory Visit: Payer: 59 | Attending: Internal Medicine

## 2019-09-14 DIAGNOSIS — Z20822 Contact with and (suspected) exposure to covid-19: Secondary | ICD-10-CM

## 2019-09-15 LAB — NOVEL CORONAVIRUS, NAA: SARS-CoV-2, NAA: NOT DETECTED

## 2019-10-15 ENCOUNTER — Ambulatory Visit: Payer: 59 | Attending: Internal Medicine

## 2019-10-15 DIAGNOSIS — Z23 Encounter for immunization: Secondary | ICD-10-CM

## 2019-10-15 NOTE — Progress Notes (Signed)
   Covid-19 Vaccination Clinic  Name:  OCEANE FOSSE    MRN: [id number] DOB: [birth year]  10/15/2019  Ms. Bendix was observed post Covid-19 immunization for 15 minutes without incident. She was provided with Vaccine Information Sheet and instruction to access the V-Safe system.   Ms. Maggi was instructed to call 911 with any severe reactions post vaccine: Marland Kitchen Difficulty breathing  . Swelling of face and throat  . A fast heartbeat  . A bad rash all over body  . Dizziness and weakness   Immunizations Administered    Name Date Dose VIS Date Route   Pfizer COVID-19 Vaccine 10/15/2019  1:25 PM 0.3 mL 07/03/2019 Intramuscular   Manufacturer: [employer], [employer]   Lot: [id number]   NDC: [address]-4

## 2019-11-04 ENCOUNTER — Other Ambulatory Visit: Payer: Self-pay

## 2019-11-04 ENCOUNTER — Encounter: Payer: Self-pay | Admitting: Podiatry

## 2019-11-04 ENCOUNTER — Ambulatory Visit: Payer: 59 | Admitting: Podiatry

## 2019-11-04 DIAGNOSIS — L600 Ingrowing nail: Secondary | ICD-10-CM

## 2019-11-04 NOTE — Progress Notes (Signed)
Subjective:   Patient ID: Kathy [name], female   DOB: 54 y.o.   MRN: [id number]   HPI Patient presents stating that her left hallux is painful and its been going on for around a month and she does not remember injury and states it is irritated   Review of Systems  All other systems reviewed and are negative.       Objective:  Physical Exam Vitals and nursing note reviewed.  Constitutional:      Appearance: She is well-developed.  Pulmonary:     Effort: Pulmonary effort is normal.  Musculoskeletal:        General: Normal range of motion.  Skin:    General: Skin is warm.  Neurological:     Mental Status: She is alert.     Neurovascular status intact muscle strength found to be adequate range of motion within normal limits with patient found to have an incurvated left hallux medial border that is painful when pressed with no active drainage or redness noted.  Patient has good digital perfusion well oriented x3     Assessment:  Ingrown toenail deformity left hallux medial border with moderate pain     Plan:  H&P reviewed condition I do think correction would be best for her but she is going to the beach next week and would not want to take a chance of creating infection so in a way till she gets back in today I did a trimming of the area to reduce all stress.  Patient will be seen back to recheck      ing

## 2019-11-09 ENCOUNTER — Ambulatory Visit: Payer: 59 | Attending: Internal Medicine

## 2019-11-09 DIAGNOSIS — Z23 Encounter for immunization: Secondary | ICD-10-CM

## 2019-11-09 DIAGNOSIS — Z20822 Contact with and (suspected) exposure to covid-19: Secondary | ICD-10-CM

## 2019-11-09 NOTE — Progress Notes (Signed)
   Covid-19 Vaccination Clinic  Name:  MIRCA YALE    MRN: [id number] DOB: 01-Dec-1965  11/09/2019  Ms. Fresquez was observed post Covid-19 immunization for 15 minutes without incident. She was provided with Vaccine Information Sheet and instruction to access the V-Safe system.   Ms. Williamson was instructed to call 911 with any severe reactions post vaccine: Marland Kitchen Difficulty breathing  . Swelling of face and throat  . A fast heartbeat  . A bad rash all over body  . Dizziness and weakness   Immunizations Administered    Name Date Dose VIS Date Route   Pfizer COVID-19 Vaccine 11/09/2019  1:24 PM 0.3 mL 09/16/2018 Intramuscular   Manufacturer: [employer], [employer]   Lot: [id number]   NDC: [address]-0

## 2019-11-10 LAB — NOVEL CORONAVIRUS, NAA: SARS-CoV-2, NAA: NOT DETECTED

## 2019-11-10 LAB — SARS-COV-2, NAA 2 DAY TAT

## 2019-12-15 ENCOUNTER — Other Ambulatory Visit: Payer: Self-pay

## 2019-12-15 ENCOUNTER — Ambulatory Visit (INDEPENDENT_AMBULATORY_CARE_PROVIDER_SITE_OTHER)
Admission: RE | Admit: 2019-12-15 | Discharge: 2019-12-15 | Disposition: A | Discharge status: Home / Self Care | Payer: 59 | Source: Ambulatory Visit

## 2019-12-15 DIAGNOSIS — J209 Acute bronchitis, unspecified: Secondary | ICD-10-CM

## 2019-12-15 DIAGNOSIS — J069 Acute upper respiratory infection, unspecified: Secondary | ICD-10-CM

## 2019-12-15 MED ORDER — ALBUTEROL SULFATE HFA 108 (90 BASE) MCG/ACT IN AERS: 2.0000 | INHALATION_SPRAY | RESPIRATORY_TRACT | 0 refills | Status: DC | PRN

## 2019-12-15 MED ORDER — AZITHROMYCIN 250 MG PO TABS: ORAL_TABLET | ORAL | 0 refills | Status: DC

## 2019-12-15 MED ORDER — BENZONATATE 100 MG PO CAPS: 100.0000 mg | ORAL_CAPSULE | Freq: Three times a day (TID) | ORAL | 0 refills | Status: DC

## 2019-12-15 NOTE — ED Provider Notes (Signed)
[facility]  Virtual Visit via Video Note:  BETHANY HIRT  initiated request for Telemedicine visit with [facility] team. I connected with Nelma Rothman  on 12/15/2019 at 8:45 AM  for a synchronized telemedicine visit using a video enabled HIPPA compliant telemedicine application. I verified that I am speaking with Nelma Rothman  using two identifiers. Marykay Lex, NP  was physically located in a [facility] Urgent care site and INAYAH WOODIN was located at a different location.   The limitations of evaluation and management by telemedicine as well as the availability of in-person appointments were discussed. Patient was informed that she  may incur a bill ( including co-pay) for this virtual visit encounter. Teresa Pelton Loor  expressed understanding and gave verbal consent to proceed with virtual visit.   [id number] 12/15/19 Arrival Time: 4497  NP:YYFR THROAT  SUBJECTIVE: History from: patient.  SHANNEL ZAHM is a 54 y.o. female who presents with abrupt onset of sore throat, bilateral ear pain, cough, nasal congestion, wheezing, SOB with coughing for 5 days. Denies sick exposure to strep, flu or mono, or precipitating event. Has made no attempts to treat at home. There are not aggravating symptoms. Tolerating liquids and own secretions without difficulty.   Denies fever, chills, fatigue, sinus pain, rhinorrhea, chest pain, nausea, rash, changes in bowel or bladder habits.     ROS: As per HPI.  All other pertinent ROS negative.     Past Medical History:  Diagnosis Date  . ACL (anterior cruciate ligament) tear   . Acute meniscal tear of left knee   . OA (osteoarthritis) of knee    LEFT   Past Surgical History:  Procedure Laterality Date  . BREAST BIOPSY    . CLOSED REDUCTION NASAL FRACTURE N/A 07/29/2013   Procedure:  OPEN REPAIR OF SEPTAL NASAL FRACTURE;  Surgeon: Wayland Denis, DO;  Location: [facility];  Service: Plastics;   Laterality: N/A;  . KNEE ARTHROSCOPY WITH ANTERIOR CRUCIATE LIGAMENT (ACL) REPAIR WITH HAMSTRING GRAFT Left 09/11/2013   Procedure: LEFT KNEE ARTHROSCOPY WITH ALLOGRAFT ANTERIOR CRUCIATE LIGAMENT (ACL) RECONSTRUCTION PARTIAL MENISCECTOMY  CHONDROPLASTY ;  Surgeon: Eugenia Mcalpine, MD;  Location: [facility];  Service: Orthopedics;  Laterality: Left;  . TONSILLECTOMY  1996  . TUBAL LIGATION  2007   No Known Allergies No current facility-administered medications on file prior to encounter.   Current Outpatient Medications on File Prior to Encounter  Medication Sig Dispense Refill  . fluorouracil (EFUDEX) 5 % cream fluorouracil 5 % topical cream    . FLUoxetine (PROZAC) 20 MG capsule SMARTSIG:1 Capsule(s) By Mouth Every Evening    . progesterone (PROMETRIUM) 100 MG capsule progesterone micronized 100 mg capsule  TAKE 1 CAPSULE BY MOUTH EVERY NIGHT AT BEDTIME    . progesterone (PROMETRIUM) 100 MG capsule Take 100 mg by mouth at bedtime.    . triamterene-hydrochlorothiazide (MAXZIDE-25) 37.5-25 MG per tablet Take 1 tablet by mouth daily.     Marland Kitchen VIVELLE-DOT 0.075 MG/24HR 1 patch 2 (two) times a week.    . zolpidem (AMBIEN) 10 MG tablet TK 1 T PO HS PRF INSOMNIA  4   Social History   Socioeconomic History  . Marital status: Married    Spouse name: Not on file  . Number of children: Not on file  . Years of education: Not on file  . Highest education level: Not on file  Occupational History  . Not on file  Tobacco  Use  . Smoking status: Never Smoker  . Smokeless tobacco: Never Used  Substance and Sexual Activity  . Alcohol use: Yes    Comment: OCCASIONAL  . Drug use: No  . Sexual activity: Not on file  Other Topics Concern  . Not on file  Social History Narrative  . Not on file   Social Determinants of Health   Financial Resource Strain:   . Difficulty of Paying Living Expenses:   Food Insecurity:   . Worried About Programme researcher, broadcasting/film/video in the Last Year:   . Garment/textile technologist in the Last Year:   Transportation Needs:   . Freight forwarder (Medical):   Marland Kitchen Lack of Transportation (Non-Medical):   Physical Activity:   . Days of Exercise per Week:   . Minutes of Exercise per Session:   Stress:   . Feeling of Stress :   Social Connections:   . Frequency of Communication with Friends and Family:   . Frequency of Social Gatherings with Friends and Family:   . Attends Religious Services:   . Active Member of Clubs or Organizations:   . Attends Banker Meetings:   Marland Kitchen Marital Status:   Intimate Partner Violence:   . Fear of Current or Ex-Partner:   . Emotionally Abused:   Marland Kitchen Physically Abused:   . Sexually Abused:    Family History  Problem Relation Age of Onset  . Cancer Father   . Heart block Father   . Heart disease Father 39  . Heart failure Maternal Grandmother   . Heart failure Maternal Grandfather   . Heart failure Paternal Grandmother   . Heart failure Paternal Grandfather   . Heart attack Brother 22       mi    OBJECTIVE:   There were no vitals filed for this visit.  General appearance: alert; no distress Eyes: EOMI grossly HENT: normocephalic; atraumatic Neck: supple with FROM Lungs: normal respiratory effort; speaking in full sentences without difficulty, moderate productive cough present Extremities: moves extremities without difficulty Skin: No obvious rashes Neurologic: No facial asymmetries Psychological: alert and cooperative; normal mood and affect  ASSESSMENT & PLAN:  1. Upper respiratory tract infection, unspecified type   2. Acute bronchitis, unspecified organism     Meds ordered this encounter  Medications  . azithromycin (ZITHROMAX Z-PAK) 250 MG tablet    Sig: Take 2 tablets today, and 1 tablet for the next 4 days.    Dispense:  6 tablet    Refill:  0    Order Specific Question:   Supervising Provider    Answer:   Merrilee Jansky [id number]  . albuterol (VENTOLIN HFA) 108 (90 Base) MCG/ACT  inhaler    Sig: Inhale 2 puffs into the lungs every 4 (four) hours as needed for wheezing or shortness of breath.    Dispense:  18 g    Refill:  0    Order Specific Question:   Supervising Provider    Answer:   Merrilee Jansky [id number]  . benzonatate (TESSALON) 100 MG capsule    Sig: Take 1 capsule (100 mg total) by mouth every 8 (eight) hours.    Dispense:  21 capsule    Refill:  0    Order Specific Question:   Supervising Provider    Answer:   Merrilee Jansky [id number]     Push fluids and get rest Prescribed azithromycin 250mg .  Take as directed and to completion.  Prescribed tessalon perles  Prescribed albuterol inhaler Drink warm or cool liquids, use throat lozenges, or popsicles to help alleviate symptoms Take OTC ibuprofen or tylenol as needed for pain Follow up with PCP if symptoms persist Return or go to ER if you have any new or worsening symptoms such as fever, chills, nausea, vomiting, worsening sore throat, cough, abdominal pain, chest pain, changes in bowel or bladder habits  I discussed the assessment and treatment plan with the patient. The patient was provided an opportunity to ask questions and all were answered. The patient agreed with the plan and demonstrated an understanding of the instructions.   The patient was advised to call back or seek an in-person evaluation if the symptoms worsen or if the condition fails to improve as anticipated.  I provided 10 minutes of non-face-to-face time during this encounter.  Bettey Mare, NP  12/15/2019 8:45 AM         Faustino Congress, NP 12/15/19 [phone]

## 2019-12-15 NOTE — Discharge Instructions (Addendum)
You have an upper respiratory infection and bronchitis.  I have prescribed a z pack. Take 2 tablets today and one tablet daily for the next 4 days.  I have prescribed tessalon perles for your cough.  I have sent in an inhaler for you to use 2 puffs every 4 hours as needed for cough and wheezing.  I have prescribed a steroid taper for you to take. Take this as directed on the label.  Follow up with this office or with primary care if you are not feeling better over the next 2 days.  Follow up with the ER for trouble swallowing, trouble breathing, other concerning symptoms.

## 2020-01-11 ENCOUNTER — Encounter (HOSPITAL_COMMUNITY): Payer: Self-pay

## 2020-01-11 ENCOUNTER — Ambulatory Visit (INDEPENDENT_AMBULATORY_CARE_PROVIDER_SITE_OTHER): Payer: 59

## 2020-01-11 ENCOUNTER — Ambulatory Visit (HOSPITAL_COMMUNITY)
Admission: EM | Admit: 2020-01-11 | Discharge: 2020-01-11 | Disposition: A | Discharge status: Home / Self Care | Payer: 59 | Attending: Family Medicine | Admitting: Family Medicine

## 2020-01-11 ENCOUNTER — Other Ambulatory Visit: Payer: Self-pay

## 2020-01-11 ENCOUNTER — Ambulatory Visit (HOSPITAL_COMMUNITY): Payer: 59

## 2020-01-11 DIAGNOSIS — Z8249 Family history of ischemic heart disease and other diseases of the circulatory system: Secondary | ICD-10-CM | POA: Diagnosis not present

## 2020-01-11 DIAGNOSIS — R062 Wheezing: Secondary | ICD-10-CM | POA: Diagnosis not present

## 2020-01-11 DIAGNOSIS — R053 Chronic cough: Secondary | ICD-10-CM

## 2020-01-11 DIAGNOSIS — Z87891 Personal history of nicotine dependence: Secondary | ICD-10-CM | POA: Diagnosis not present

## 2020-01-11 DIAGNOSIS — R05 Cough: Secondary | ICD-10-CM | POA: Diagnosis not present

## 2020-01-11 DIAGNOSIS — Z20822 Contact with and (suspected) exposure to covid-19: Secondary | ICD-10-CM | POA: Diagnosis not present

## 2020-01-11 LAB — SARS CORONAVIRUS 2 (TAT 6-24 HRS): SARS Coronavirus 2: NEGATIVE

## 2020-01-11 MED ORDER — PREDNISONE 10 MG (21) PO TBPK
ORAL_TABLET | Freq: Every day | ORAL | 0 refills | Status: DC
Start: 2020-01-11 — End: 2021-06-21

## 2020-01-11 NOTE — Discharge Instructions (Signed)
You have been tested for COVID-19 today. °If your test returns positive, you will receive a phone call from  regarding your results. °Negative test results are not called. °Both positive and negative results area always visible on MyChart. °If you do not have a MyChart account, sign up instructions are provided in your discharge papers. °Please do not hesitate to contact us should you have questions or concerns. ° °

## 2020-01-11 NOTE — ED Triage Notes (Signed)
Pt presents with continued productive cough for over 3 weeks, pt had virtual visit on 5/25 and was prescribed medication that has not brought much relief.

## 2020-01-13 NOTE — ED Provider Notes (Signed)
[place]   [id number] 01/11/20 Arrival Time: 4010  ASSESSMENT & PLAN:  1. Persistent cough for 3 weeks or longer   2. Wheezing     I have personally viewed the imaging studies ordered this visit. Normal CXR without PNA.  COVID-19 testing sent.   Questions wheezing at times; albuterol helps. Discussed possibility of post-viral cough.  Begin trial of: Meds ordered this encounter  Medications  . predniSONE (STERAPRED UNI-PAK 21 TAB) 10 MG (21) TBPK tablet    Sig: Take by mouth daily. Take as directed.    Dispense:  21 tablet    Refill:  0      Follow-up Information    [facility].   Specialty: Urgent Care Why: If worsening or failing to improve as anticipated. Contact information: [place] [phone]              Reviewed expectations re: course of current medical issues. Questions answered. Outlined signs and symptoms indicating need for more acute intervention. Understanding verbalized. After Visit Summary given.   SUBJECTIVE: History from: patient. CENA BRUHN is a 54 y.o. female who reports a persistent, productive at times, cough for the past 3-4 weeks. Questions wheezing. Was prev given an albuterol inhaler that has been helping. Recent travel to beach. No sick contacts. No associated SOB or CP reported. Cough does affect sleep. Normal PO intake without n/v.   Previous smoking history.  OBJECTIVE:  Vitals:   01/11/20 1523  BP: 129/67  Pulse: 60  Resp: 17  Temp: 98 F (36.7 C)  TempSrc: Oral  SpO2: 97%    General appearance: alert; no distress Eyes: PERRLA; EOMI; conjunctiva normal HENT: [place]; AT; nasal mucosa normal; oral mucosa normal Neck: supple  Lungs: speaks full sentences without difficulty; unlabored; no active wheezing Extremities: no edema Skin: warm and dry Neurologic: normal gait Psychological: alert and cooperative; normal mood and  affect  Labs:  Labs Reviewed  SARS CORONAVIRUS 2 (TAT 6-24 HRS)    Imaging: DG Chest 2 View  Result Date: 01/11/2020 CLINICAL DATA:  Cough. EXAM: CHEST - 2 VIEW COMPARISON:  None. FINDINGS: The heart size and mediastinal contours are within normal limits. Both lungs are clear. The visualized skeletal structures are unremarkable. IMPRESSION: No active cardiopulmonary disease. Electronically Signed   By: Marijo Conception M.D.   On: 01/11/2020 16:20    No Known Allergies  Past Medical History:  Diagnosis Date  . ACL (anterior cruciate ligament) tear   . Acute meniscal tear of left knee   . OA (osteoarthritis) of knee    LEFT   Social History   Socioeconomic History  . Marital status: Married    Spouse name: Not on file  . Number of children: Not on file  . Years of education: Not on file  . Highest education level: Not on file  Occupational History  . Not on file  Tobacco Use  . Smoking status: Never Smoker  . Smokeless tobacco: Never Used  Substance and Sexual Activity  . Alcohol use: Yes    Comment: OCCASIONAL  . Drug use: No  . Sexual activity: Not on file  Other Topics Concern  . Not on file  Social History Narrative  . Not on file   Social Determinants of Health   Financial Resource Strain:   . Difficulty of Paying Living Expenses:   Food Insecurity:   . Worried About Charity fundraiser in  the Last Year:   . Ran Out of Food in the Last Year:   Transportation Needs:   . Freight forwarder (Medical):   Marland Kitchen Lack of Transportation (Non-Medical):   Physical Activity:   . Days of Exercise per Week:   . Minutes of Exercise per Session:   Stress:   . Feeling of Stress :   Social Connections:   . Frequency of Communication with Friends and Family:   . Frequency of Social Gatherings with Friends and Family:   . Attends Religious Services:   . Active Member of Clubs or Organizations:   . Attends Banker Meetings:   Marland Kitchen Marital Status:   Intimate  Partner Violence:   . Fear of Current or Ex-Partner:   . Emotionally Abused:   Marland Kitchen Physically Abused:   . Sexually Abused:    Family History  Problem Relation Age of Onset  . Cancer Father   . Heart block Father   . Heart disease Father 40  . Heart failure Maternal Grandmother   . Heart failure Maternal Grandfather   . Heart failure Paternal Grandmother   . Heart failure Paternal Grandfather   . Heart attack Brother 26       mi   Past Surgical History:  Procedure Laterality Date  . BREAST BIOPSY    . CLOSED REDUCTION NASAL FRACTURE N/A 07/29/2013   Procedure:  OPEN REPAIR OF SEPTAL NASAL FRACTURE;  Surgeon: Wayland Denis, DO;  Location: [facility];  Service: Plastics;  Laterality: N/A;  . KNEE ARTHROSCOPY WITH ANTERIOR CRUCIATE LIGAMENT (ACL) REPAIR WITH HAMSTRING GRAFT Left 09/11/2013   Procedure: LEFT KNEE ARTHROSCOPY WITH ALLOGRAFT ANTERIOR CRUCIATE LIGAMENT (ACL) RECONSTRUCTION PARTIAL MENISCECTOMY  CHONDROPLASTY ;  Surgeon: Eugenia Mcalpine, MD;  Location: [facility];  Service: Orthopedics;  Laterality: Left;  . TONSILLECTOMY  1996  . TUBAL LIGATION  2007     Mardella Layman, MD 01/13/20 1008

## 2020-07-27 ENCOUNTER — Other Ambulatory Visit: Payer: Self-pay

## 2020-07-27 DIAGNOSIS — Z20822 Contact with and (suspected) exposure to covid-19: Secondary | ICD-10-CM

## 2020-07-28 ENCOUNTER — Other Ambulatory Visit: Payer: Self-pay

## 2020-07-28 DIAGNOSIS — Z20822 Contact with and (suspected) exposure to covid-19: Secondary | ICD-10-CM

## 2020-07-29 LAB — NOVEL CORONAVIRUS, NAA: SARS-CoV-2, NAA: DETECTED — AB

## 2020-07-29 LAB — SARS-COV-2, NAA 2 DAY TAT

## 2020-07-30 LAB — SARS-COV-2, NAA 2 DAY TAT

## 2020-07-30 LAB — NOVEL CORONAVIRUS, NAA: SARS-CoV-2, NAA: DETECTED — AB

## 2020-08-09 DIAGNOSIS — M542 Cervicalgia: Secondary | ICD-10-CM | POA: Diagnosis not present

## 2020-08-16 DIAGNOSIS — M542 Cervicalgia: Secondary | ICD-10-CM | POA: Diagnosis not present

## 2020-08-24 DIAGNOSIS — M5412 Radiculopathy, cervical region: Secondary | ICD-10-CM | POA: Diagnosis not present

## 2020-09-02 DIAGNOSIS — M25562 Pain in left knee: Secondary | ICD-10-CM | POA: Diagnosis not present

## 2020-09-14 DIAGNOSIS — M5412 Radiculopathy, cervical region: Secondary | ICD-10-CM | POA: Diagnosis not present

## 2020-09-19 DIAGNOSIS — M9902 Segmental and somatic dysfunction of thoracic region: Secondary | ICD-10-CM | POA: Diagnosis not present

## 2020-09-19 DIAGNOSIS — M50323 Other cervical disc degeneration at C6-C7 level: Secondary | ICD-10-CM | POA: Diagnosis not present

## 2020-09-19 DIAGNOSIS — M9901 Segmental and somatic dysfunction of cervical region: Secondary | ICD-10-CM | POA: Diagnosis not present

## 2020-09-19 DIAGNOSIS — M4716 Other spondylosis with myelopathy, lumbar region: Secondary | ICD-10-CM | POA: Diagnosis not present

## 2020-09-20 DIAGNOSIS — E041 Nontoxic single thyroid nodule: Secondary | ICD-10-CM | POA: Diagnosis not present

## 2020-09-20 DIAGNOSIS — Z1331 Encounter for screening for depression: Secondary | ICD-10-CM | POA: Diagnosis not present

## 2020-09-20 DIAGNOSIS — R7989 Other specified abnormal findings of blood chemistry: Secondary | ICD-10-CM | POA: Diagnosis not present

## 2020-09-22 ENCOUNTER — Other Ambulatory Visit: Payer: Self-pay | Admitting: Internal Medicine

## 2020-09-22 DIAGNOSIS — E041 Nontoxic single thyroid nodule: Secondary | ICD-10-CM

## 2020-10-07 ENCOUNTER — Ambulatory Visit
Admission: RE | Admit: 2020-10-07 | Discharge: 2020-10-07 | Disposition: A | Discharge status: Home / Self Care | Payer: Self-pay | Source: Ambulatory Visit | Attending: Internal Medicine | Admitting: Internal Medicine

## 2020-10-07 DIAGNOSIS — E041 Nontoxic single thyroid nodule: Secondary | ICD-10-CM

## 2020-10-14 DIAGNOSIS — L578 Other skin changes due to chronic exposure to nonionizing radiation: Secondary | ICD-10-CM | POA: Diagnosis not present

## 2020-10-14 DIAGNOSIS — L570 Actinic keratosis: Secondary | ICD-10-CM | POA: Diagnosis not present

## 2020-10-14 DIAGNOSIS — L720 Epidermal cyst: Secondary | ICD-10-CM | POA: Diagnosis not present

## 2020-10-14 DIAGNOSIS — D225 Melanocytic nevi of trunk: Secondary | ICD-10-CM | POA: Diagnosis not present

## 2020-10-26 DIAGNOSIS — Z01419 Encounter for gynecological examination (general) (routine) without abnormal findings: Secondary | ICD-10-CM | POA: Diagnosis not present

## 2020-10-26 DIAGNOSIS — Z6823 Body mass index (BMI) 23.0-23.9, adult: Secondary | ICD-10-CM | POA: Diagnosis not present

## 2020-10-26 DIAGNOSIS — Z1231 Encounter for screening mammogram for malignant neoplasm of breast: Secondary | ICD-10-CM | POA: Diagnosis not present

## 2020-10-28 DIAGNOSIS — Z20822 Contact with and (suspected) exposure to covid-19: Secondary | ICD-10-CM | POA: Diagnosis not present

## 2020-11-01 ENCOUNTER — Telehealth: Payer: Self-pay

## 2020-11-01 NOTE — Telephone Encounter (Signed)
Referral notes sent from [facility], Phone #: [phone], Fax #: [phone]   Notes sent to scheduling

## 2021-02-10 ENCOUNTER — Ambulatory Visit: Payer: BC Managed Care – PPO | Admitting: Cardiology

## 2021-02-10 ENCOUNTER — Other Ambulatory Visit: Payer: Self-pay

## 2021-02-10 ENCOUNTER — Encounter: Payer: Self-pay | Admitting: Cardiology

## 2021-02-10 VITALS — BP 118/80 | HR 55 | Ht 63.0 in | Wt 145.8 lb

## 2021-02-10 DIAGNOSIS — Z8249 Family history of ischemic heart disease and other diseases of the circulatory system: Secondary | ICD-10-CM

## 2021-02-10 NOTE — Addendum Note (Signed)
Addended by: Theresia Majors on: 02/10/2021 09:15 AM   Modules accepted: Orders

## 2021-02-10 NOTE — Patient Instructions (Signed)
Medication Instructions:  Your physician recommends that you continue on your current medications as directed. Please refer to the Current Medication list given to you today.  *If you need a refill on your cardiac medications before your next appointment, please call your pharmacy*  Testing/Procedures: Your physician has requested that you have an exercise tolerance test. For further information please visit [email or web address] Please also follow instruction sheet, as given.  Your physician has requested that you have a calcium score CT scan.   Follow-Up: At [facility], you and your health needs are our priority.  As part of our continuing mission to provide you with exceptional heart care, we have created designated Provider Care Teams.  These Care Teams include your primary Cardiologist (physician) and Advanced Practice Providers (APPs -  Physician Assistants and Nurse Practitioners) who all work together to provide you with the care you need, when you need it.  Your next appointment:   1 year(s)  The format for your next appointment:   In Person  Provider:   You may see Armanda Magic, MD or one of the following Advanced Practice Providers on your designated Care Team:   Ronie Spies, PA-C Jacolyn Reedy, PA-C

## 2021-02-10 NOTE — Addendum Note (Signed)
Addended by: Quintella Reichert on: 02/10/2021 09:29 AM   Modules accepted: Orders

## 2021-02-10 NOTE — Progress Notes (Signed)
Cardiology Consult  Note    Date:  02/10/2021   ID:  Kathy [name], DOB Jun 15, 1966, MRN [id number]  PCP:  Richardean Chimera, MD  Cardiologist:  Armanda Magic, MD   Chief Complaint  Patient presents with   New Patient (Initial Visit)    Family history of premature CAD     History of Present Illness:  Kathy [name] is a 55 y.o. female who is being seen today for the evaluation of family history of CAD at the request of McComb, John, MD.  This is a 55 year old female with no significant past medical history who was referred here for evaluation because she has a strong family history of CAD on both her mother and father side of the family.  Her father had an MI in his 11's and her brother had an MI at 59.  She was initially seen by me over 3 years ago for cardiac workup.  This showed a coronary Ca score of 0 and stress echo showed no ischemia.   She is here today and is doing well.  She denies any chest pain or pressure, SOB, DOE, LE edema, dizziness, palpitations or syncope.   Past Medical History:  Diagnosis Date   ACL (anterior cruciate ligament) tear    Acute meniscal tear of left knee    OA (osteoarthritis) of knee    LEFT    Past Surgical History:  Procedure Laterality Date   BREAST BIOPSY     CLOSED REDUCTION NASAL FRACTURE N/A 07/29/2013   Procedure:  OPEN REPAIR OF SEPTAL NASAL FRACTURE;  Surgeon: Wayland Denis, DO;  Location: [facility];  Service: Plastics;  Laterality: N/A;   KNEE ARTHROSCOPY WITH ANTERIOR CRUCIATE LIGAMENT (ACL) REPAIR WITH HAMSTRING GRAFT Left 09/11/2013   Procedure: LEFT KNEE ARTHROSCOPY WITH ALLOGRAFT ANTERIOR CRUCIATE LIGAMENT (ACL) RECONSTRUCTION PARTIAL MENISCECTOMY  CHONDROPLASTY ;  Surgeon: Eugenia Mcalpine, MD;  Location: [facility] [place];  Service: Orthopedics;  Laterality: Left;   TONSILLECTOMY  1996   TUBAL LIGATION  2007    Current Medications: Current Meds  Medication Sig   fluorouracil (EFUDEX) 5 % cream  fluorouracil 5 % topical cream   FLUoxetine (PROZAC) 20 MG capsule SMARTSIG:1 Capsule(s) By Mouth Every Evening   progesterone (PROMETRIUM) 100 MG capsule progesterone micronized 100 mg capsule  TAKE 1 CAPSULE BY MOUTH EVERY NIGHT AT BEDTIME   triamterene-hydrochlorothiazide (MAXZIDE-25) 37.5-25 MG per tablet Take 1 tablet by mouth as needed.   zolpidem (AMBIEN) 10 MG tablet TK 1 T PO HS PRF INSOMNIA    Allergies:   Patient has no known allergies.   Social History   Socioeconomic History   Marital status: Married    Spouse name: Not on file   Number of children: Not on file   Years of education: Not on file   Highest education level: Not on file  Occupational History   Not on file  Tobacco Use   Smoking status: Never   Smokeless tobacco: Never  Substance and Sexual Activity   Alcohol use: Yes    Comment: OCCASIONAL   Drug use: No   Sexual activity: Not on file  Other Topics Concern   Not on file  Social History Narrative   Not on file   Social Determinants of Health   Financial Resource Strain: Not on file  Food Insecurity: Not on file  Transportation Needs: Not on file  Physical Activity: Not on file  Stress: Not on file  Social Connections: Not on  file     Family History:  The patient's family history includes Cancer in her father; Heart attack (age of onset: 25) in her brother; Heart block in her father; Heart disease (age of onset: 99) in her father; Heart failure in her maternal grandfather, maternal grandmother, paternal grandfather, and paternal grandmother.   ROS:   Please see the history of present illness.    ROS All other systems reviewed and are negative.  No flowsheet data found.   PHYSICAL EXAM:   VS:  BP 118/80   Pulse (!) 55   Ht 5\' 3"  (1.6 m)   Wt 145 lb 12.8 oz (66.1 kg)   SpO2 97%   BMI 25.83 kg/m     GEN: Well nourished, well developed in no acute distress HEENT: Normal NECK: No JVD; No carotid bruits LYMPHATICS: No  lymphadenopathy CARDIAC:RRR, no murmurs, rubs, gallops RESPIRATORY:  Clear to auscultation without rales, wheezing or rhonchi  ABDOMEN: Soft, non-tender, non-distended MUSCULOSKELETAL:  No edema; No deformity  SKIN: Warm and dry NEUROLOGIC:  Alert and oriented x 3 PSYCHIATRIC:  Normal affect    Wt Readings from Last 3 Encounters:  02/10/21 145 lb 12.8 oz (66.1 kg)  09/26/17 157 lb (71.2 kg)  09/11/13 157 lb (71.2 kg)      Studies/Labs Reviewed:   EKG:  EKG is ordered today.  The ekg ordered today demonstrates sinus bradycardia with no ST changes and normal intervals  Recent Labs: No results found for requested labs within last 8760 hours.   Lipid Panel No results found for: CHOL, TRIG, HDL, CHOLHDL, VLDL, LDLCALC, LDLDIRECT  Additional studies/ records that were reviewed today include:  none    ASSESSMENT:    1. Family history of early CAD      PLAN:  In order of problems listed above:  1.  Family history of premature CAD  - she has a history of early CAD on both her maternal and paternal sides of the family. Her father had an MI at 48 and her brother had an MI at 8.    -She was exposed to second had smoke as a child.   -stress echo in 2019 showed no ischemia and normal LVF -coronary Ca score was 0 -her EKG remains nonischemic -I will repeat a coronary Ca score and get an ETT -Shared Decision Making/Informed Consent{ The risks [chest pain, shortness of breath, cardiac arrhythmias, dizziness, blood pressure fluctuations, myocardial infarction, stroke/transient ischemic attack, and life-threatening complications (estimated to be 1 in 10,000)], benefits (risk stratification, diagnosing coronary artery disease, treatment guidance) and alternatives of an exercise tolerance test were discussed in detail with [name] and she agrees to proceed.   Medication Adjustments/Labs and Tests Ordered: Current medicines are reviewed at length with the patient today.  Concerns  regarding medicines are outlined above.  Medication changes, Labs and Tests ordered today are listed in the Patient Instructions below.  There are no Patient Instructions on file for this visit.   Signed, Wyline Mood, MD  02/10/2021 9:06 AM    [facility] [address] [place], [place], [place]  [place] Phone: [phone]; Fax: [phone]

## 2021-02-21 ENCOUNTER — Ambulatory Visit (INDEPENDENT_AMBULATORY_CARE_PROVIDER_SITE_OTHER)
Admission: RE | Admit: 2021-02-21 | Discharge: 2021-02-21 | Disposition: A | Discharge status: Home / Self Care | Payer: Self-pay | Source: Ambulatory Visit | Attending: Cardiology | Admitting: Cardiology

## 2021-02-21 ENCOUNTER — Other Ambulatory Visit: Payer: Self-pay

## 2021-02-21 ENCOUNTER — Ambulatory Visit (INDEPENDENT_AMBULATORY_CARE_PROVIDER_SITE_OTHER): Payer: BC Managed Care – PPO

## 2021-02-21 DIAGNOSIS — Z8249 Family history of ischemic heart disease and other diseases of the circulatory system: Secondary | ICD-10-CM

## 2021-02-21 LAB — EXERCISE TOLERANCE TEST
Estimated workload: 13.4 METS
Exercise duration (min): 12 min
Exercise duration (sec): 0 s
MPHR: 164 {beats}/min
Peak HR: 140 {beats}/min
Percent HR: 85 %
RPE: 17
Rest HR: 68 {beats}/min

## 2021-06-18 ENCOUNTER — Emergency Department (HOSPITAL_COMMUNITY): Payer: BC Managed Care – PPO

## 2021-06-18 ENCOUNTER — Other Ambulatory Visit: Payer: Self-pay

## 2021-06-18 ENCOUNTER — Encounter (HOSPITAL_COMMUNITY): Payer: Self-pay | Admitting: Emergency Medicine

## 2021-06-18 ENCOUNTER — Emergency Department (HOSPITAL_COMMUNITY)
Admission: EM | Admit: 2021-06-18 | Discharge: 2021-06-18 | Disposition: A | Discharge status: Home / Self Care | Payer: BC Managed Care – PPO | Attending: Emergency Medicine | Admitting: Emergency Medicine

## 2021-06-18 DIAGNOSIS — S01412A Laceration without foreign body of left cheek and temporomandibular area, initial encounter: Secondary | ICD-10-CM | POA: Insufficient documentation

## 2021-06-18 DIAGNOSIS — R55 Syncope and collapse: Secondary | ICD-10-CM | POA: Diagnosis not present

## 2021-06-18 DIAGNOSIS — H1132 Conjunctival hemorrhage, left eye: Secondary | ICD-10-CM | POA: Insufficient documentation

## 2021-06-18 DIAGNOSIS — R5383 Other fatigue: Secondary | ICD-10-CM | POA: Diagnosis not present

## 2021-06-18 DIAGNOSIS — W01198A Fall on same level from slipping, tripping and stumbling with subsequent striking against other object, initial encounter: Secondary | ICD-10-CM | POA: Insufficient documentation

## 2021-06-18 DIAGNOSIS — Z23 Encounter for immunization: Secondary | ICD-10-CM | POA: Insufficient documentation

## 2021-06-18 DIAGNOSIS — M7989 Other specified soft tissue disorders: Secondary | ICD-10-CM | POA: Diagnosis not present

## 2021-06-18 DIAGNOSIS — S022XXA Fracture of nasal bones, initial encounter for closed fracture: Secondary | ICD-10-CM | POA: Diagnosis not present

## 2021-06-18 DIAGNOSIS — S0993XA Unspecified injury of face, initial encounter: Secondary | ICD-10-CM | POA: Diagnosis not present

## 2021-06-18 LAB — TROPONIN I (HIGH SENSITIVITY)
Troponin I (High Sensitivity): 4 ng/L (ref <18)
Troponin I (High Sensitivity): 5 ng/L (ref <18)

## 2021-06-18 LAB — CBC WITH DIFFERENTIAL/PLATELET
Abs Immature Granulocytes: 0.01 10*3/uL (ref 0.00–0.07)
Basophils Absolute: 0.1 10*3/uL (ref 0.0–0.1)
Basophils Relative: 1 %
Eosinophils Absolute: 0.1 10*3/uL (ref 0.0–0.5)
Eosinophils Relative: 1 %
HCT: 39.5 % (ref 36.0–46.0)
Hemoglobin: 13.5 g/dL (ref 12.0–15.0)
Immature Granulocytes: 0 %
Lymphocytes Relative: 22 %
Lymphs Abs: 1.5 10*3/uL (ref 0.7–4.0)
MCH: 33.3 pg (ref 26.0–34.0)
MCHC: 34.2 g/dL (ref 30.0–36.0)
MCV: 97.5 fL (ref 80.0–100.0)
Monocytes Absolute: 0.3 10*3/uL (ref 0.1–1.0)
Monocytes Relative: 5 %
Neutro Abs: 4.9 10*3/uL (ref 1.7–7.7)
Neutrophils Relative %: 71 %
Platelets: 296 10*3/uL (ref 150–400)
RBC: 4.05 MIL/uL (ref 3.87–5.11)
RDW: 12.7 % (ref 11.5–15.5)
WBC: 6.9 10*3/uL (ref 4.0–10.5)
nRBC: 0 % (ref 0.0–0.2)

## 2021-06-18 LAB — I-STAT BETA HCG BLOOD, ED (MC, WL, AP ONLY): I-stat hCG, quantitative: <5 m[IU]/mL (ref <5)

## 2021-06-18 LAB — BASIC METABOLIC PANEL
Anion gap: 7 (ref 5–15)
BUN: 7 mg/dL (ref 6–20)
CO2: 26 mmol/L (ref 22–32)
Calcium: 8.6 mg/dL — ABNORMAL LOW (ref 8.9–10.3)
Chloride: 105 mmol/L (ref 98–111)
Creatinine, Ser: 0.61 mg/dL (ref 0.44–1.00)
GFR, Estimated: >60 mL/min (ref >60)
Glucose, Bld: 96 mg/dL (ref 70–99)
Potassium: 3.8 mmol/L (ref 3.5–5.1)
Sodium: 138 mmol/L (ref 135–145)

## 2021-06-18 LAB — MAGNESIUM: Magnesium: 1.9 mg/dL (ref 1.7–2.4)

## 2021-06-18 LAB — CBG MONITORING, ED: Glucose-Capillary: 82 mg/dL (ref 70–99)

## 2021-06-18 MED ORDER — SODIUM CHLORIDE 0.9 % IV BOLUS
1000.0000 mL | Freq: Once | INTRAVENOUS | Status: AC
  Administered 2021-06-18: 13:00:00 1000 mL via INTRAVENOUS

## 2021-06-18 MED ORDER — TETANUS-DIPHTH-ACELL PERTUSSIS 5-2.5-18.5 LF-MCG/0.5 IM SUSY
0.5000 mL | PREFILLED_SYRINGE | Freq: Once | INTRAMUSCULAR | Status: AC
  Administered 2021-06-18: 13:00:00 0.5 mL via INTRAMUSCULAR
  Filled 2021-06-18: qty 0.5

## 2021-06-18 MED ORDER — KETOROLAC TROMETHAMINE 15 MG/ML IJ SOLN
15.0000 mg | Freq: Once | INTRAMUSCULAR | Status: AC
  Administered 2021-06-18: 13:00:00 15 mg via INTRAVENOUS
  Filled 2021-06-18: qty 1

## 2021-06-18 MED ORDER — LACTATED RINGERS IV BOLUS: 1000.0000 mL | Freq: Once | INTRAVENOUS | Status: DC

## 2021-06-18 NOTE — ED Provider Notes (Signed)
Emergency Medicine Provider Triage Evaluation Note  Kathy [name] , a 55 y.o. female  was evaluated in triage.  Pt complains of syncope.  Review of Systems  Positive: Syncope, facial injury, headache Negative: Focal numbness, focal weakness, neck pain, cp  Physical Exam  There were no vitals taken for this visit. Gen:   Awake, no distress   Resp:  Normal effort  MSK:   Moves extremities without difficulty  Other:  Abrasion to L side of face, ecchymosis L orbital, dry blood in L nares, subconjunctival hemorrhage L eye  Medical Decision Making  Medically screening exam initiated at 10:32 AM.  Appropriate orders placed.  Kathy [name] was informed that the remainder of the evaluation will be completed by another provider, this initial triage assessment does not replace that evaluation, and the importance of remaining in the ED until their evaluation is complete.  Pt went out with husband to celebrate last night and drank a moderate amount of alcohol. Got up this morning and once out of bed she fell forward striking her face and had a witnessed syncopal episode.  Presents with facial injury.    Fayrene Helper, PA-C 06/18/21 1102    Gloris Manchester, MD 06/20/21 [phone]

## 2021-06-18 NOTE — ED Triage Notes (Signed)
Pt had syncopal episode this morning and hit L cheek on carpet next to bed.  Doesn't remember what happened.  Husband reports pt had eyes open and was answering questions but confused.  Skin tear to L cheek.  Pt reports feeling a little lightheaded now and mild pain to cheek.

## 2021-06-18 NOTE — ED Provider Notes (Signed)
[facility] EMERGENCY DEPARTMENT Provider Note   CSN: [id number] Arrival date & time: 06/18/21  0946     History Chief Complaint  Patient presents with   Loss of Consciousness    Kathy [name] is a 55 y.o. female.   Loss of Consciousness Associated symptoms: no chest pain, no diaphoresis, no dizziness, no fever, no headaches, no nausea, no palpitations, no seizures, no shortness of breath, no vomiting and no weakness   Patient presents for syncopal episode.  Episode occurred this morning, at around 9 AM, upon getting out of bed.  At the time, she stood up and walked around the edge of the bed.  She then fell forward striking her face.  Her husband did witness this fall.  Husband set the patient up and states that her eyes were open but she was not responding to him for several seconds.  Following this, she was awake but seemed confused for approximately 1 minute.  Patient, herself, does not remember this episode.  Patient has no history of seizures or syncope.  Last night, patient and her husband were out drinking alcohol.  They did return home in the early hours of the morning.  Patient has no personal cardiac history.  She does follow with a cardiology due to strong family history of early ACS.  Currently, she endorses fatigue.  She denies any shortness of breath, chest pain, dizziness, weakness, or numbness.    Past Medical History:  Diagnosis Date   ACL (anterior cruciate ligament) tear    Acute meniscal tear of left knee    OA (osteoarthritis) of knee    LEFT    Patient Active Problem List   Diagnosis Date Noted   Pain in left knee 06/10/2018   Family history of early CAD 09/26/2017   Scar of forehead 09/18/2013   Status post nasal septoplasty 09/18/2013   S/P ACL reconstruction 09/11/2013   Other reasons for seeking consultation 07/30/2013   Closed displaced fracture of nasal bone with delayed healing 07/07/2013   Facial trauma 06/23/2013    Past  Surgical History:  Procedure Laterality Date   BREAST BIOPSY     CLOSED REDUCTION NASAL FRACTURE N/A 07/29/2013   Procedure:  OPEN REPAIR OF SEPTAL NASAL FRACTURE;  Surgeon: Wayland Denis, DO;  Location: [facility];  Service: Plastics;  Laterality: N/A;   KNEE ARTHROSCOPY WITH ANTERIOR CRUCIATE LIGAMENT (ACL) REPAIR WITH HAMSTRING GRAFT Left 09/11/2013   Procedure: LEFT KNEE ARTHROSCOPY WITH ALLOGRAFT ANTERIOR CRUCIATE LIGAMENT (ACL) RECONSTRUCTION PARTIAL MENISCECTOMY  CHONDROPLASTY ;  Surgeon: Eugenia Mcalpine, MD;  Location: [facility];  Service: Orthopedics;  Laterality: Left;   TONSILLECTOMY  1996   TUBAL LIGATION  2007     OB History   No obstetric history on file.     Family History  Problem Relation Age of Onset   Cancer Father    Heart block Father    Heart disease Father 15   Heart failure Maternal Grandmother    Heart failure Maternal Grandfather    Heart failure Paternal Grandmother    Heart failure Paternal Grandfather    Heart attack Brother 34       mi    Social History   Tobacco Use   Smoking status: Never   Smokeless tobacco: Never  Substance Use Topics   Alcohol use: Yes    Comment: OCCASIONAL   Drug use: No    Home Medications Prior to Admission medications   Medication Sig  Start Date End Date Taking? Authorizing Provider  FLUoxetine (PROZAC) 20 MG capsule Take 20 mg by mouth daily. 10/20/19  Yes [provider]  progesterone (PROMETRIUM) 100 MG capsule Take 100 mg by mouth at bedtime.   Yes [provider]  zolpidem (AMBIEN) 10 MG tablet Take 5 mg by mouth at bedtime as needed for sleep. 08/14/17  Yes [provider]  albuterol (VENTOLIN HFA) 108 (90 Base) MCG/ACT inhaler Inhale 2 puffs into the lungs every 4 (four) hours as needed for wheezing or shortness of breath. Patient not taking: Reported on 02/10/2021 12/15/19   Moshe Cipro, NP  estradiol (VIVELLE-DOT) 0.05 MG/24HR patch 1 patch 2  (two) times a week. 10/19/19   [provider]  predniSONE (STERAPRED UNI-PAK 21 TAB) 10 MG (21) TBPK tablet Take by mouth daily. Take as directed. Patient not taking: Reported on 02/10/2021 01/11/20   Mardella Layman, MD    Allergies    Patient has no known allergies.  Review of Systems   Review of Systems  Constitutional:  Positive for fatigue. Negative for activity change, appetite change, chills, diaphoresis and fever.  HENT:  Negative for congestion, ear pain, rhinorrhea, sore throat and trouble swallowing.   Eyes:  Negative for pain and visual disturbance.  Respiratory:  Negative for cough, chest tightness, shortness of breath and wheezing.   Cardiovascular:  Positive for syncope. Negative for chest pain and palpitations.  Gastrointestinal:  Negative for abdominal pain, diarrhea, nausea and vomiting.  Genitourinary:  Negative for dysuria, flank pain, hematuria and pelvic pain.  Musculoskeletal:  Negative for arthralgias, back pain, joint swelling, myalgias and neck pain.  Skin:  Positive for wound. Negative for color change and rash.  Neurological:  Positive for syncope. Negative for dizziness, tremors, seizures, facial asymmetry, speech difficulty, weakness, numbness and headaches.  Hematological:  Does not bruise/bleed easily.  All other systems reviewed and are negative.  Physical Exam Updated Vital Signs BP 104/64   Pulse 67   Temp 98 F (36.7 C)   Resp 14   SpO2 98%   Physical Exam Vitals and nursing note reviewed.  Constitutional:      General: She is not in acute distress.    Appearance: Normal appearance. She is well-developed and normal weight. She is not ill-appearing, toxic-appearing or diaphoretic.  HENT:     Head: Normocephalic.     Comments: Superficial skin tear to left cheek    Right Ear: External ear normal.     Left Ear: External ear normal.     Nose: Nose normal. No congestion.     Comments: Swelling and tenderness    Mouth/Throat:     Mouth:  Mucous membranes are moist.     Pharynx: Oropharynx is clear.     Comments: No tongue bite Eyes:     General: No visual field deficit or scleral icterus.    Extraocular Movements: Extraocular movements intact.     Comments: Left subconjunctival hemorrhage  Cardiovascular:     Rate and Rhythm: Normal rate and regular rhythm.     Heart sounds: No murmur heard. Pulmonary:     Effort: Pulmonary effort is normal. No respiratory distress.     Breath sounds: Normal breath sounds. No wheezing or rales.  Chest:     Chest wall: No tenderness.  Abdominal:     General: Abdomen is flat.     Palpations: Abdomen is soft.     Tenderness: There is no abdominal tenderness.  Musculoskeletal:  General: No swelling, tenderness or deformity. Normal range of motion.     Cervical back: Normal range of motion and neck supple. No rigidity.     Right lower leg: No edema.     Left lower leg: No edema.  Skin:    General: Skin is warm and dry.     Capillary Refill: Capillary refill takes less than 2 seconds.     Coloration: Skin is not jaundiced or pale.  Neurological:     General: No focal deficit present.     Mental Status: She is alert and oriented to person, place, and time.     Cranial Nerves: Cranial nerves 2-12 are intact. No cranial nerve deficit, dysarthria or facial asymmetry.     Sensory: Sensation is intact. No sensory deficit.     Motor: Motor function is intact. No weakness, tremor, abnormal muscle tone or pronator drift.     Coordination: Coordination is intact. Coordination normal. Finger-Nose-Finger Test normal.  Psychiatric:        Mood and Affect: Mood normal.        Behavior: Behavior normal.        Thought Content: Thought content normal.        Judgment: Judgment normal.    ED Results / Procedures / Treatments   Labs (all labs ordered are listed, but only abnormal results are displayed) Labs Reviewed  BASIC METABOLIC PANEL - Abnormal; Notable for the following components:       Result Value   Calcium 8.6 (*)    All other components within normal limits  CBC WITH DIFFERENTIAL/PLATELET  MAGNESIUM  CBG MONITORING, ED  I-STAT BETA HCG BLOOD, ED ([facility], [facility], [facility] ONLY)  TROPONIN I (HIGH SENSITIVITY)  TROPONIN I (HIGH SENSITIVITY)    EKG EKG Interpretation  Date/Time:  Sunday June 18 2021 10:46:48 EST Ventricular Rate:  70 PR Interval:  134 QRS Duration: 82 QT Interval:  424 QTC Calculation: 457 R Axis:   77 Text Interpretation: Normal sinus rhythm Septal infarct , age undetermined Abnormal ECG Confirmed by Godfrey Pick [phone]) on 06/18/2021 12:04:05 PM  Radiology CT HEAD WO CONTRAST  Result Date: 06/18/2021 CLINICAL DATA:  Syncope and fall with facial injury. EXAM: CT HEAD WITHOUT CONTRAST CT MAXILLOFACIAL WITHOUT CONTRAST CT CERVICAL SPINE WITHOUT CONTRAST TECHNIQUE: Multidetector CT imaging of the head, cervical spine, and maxillofacial structures were performed using the standard protocol without intravenous contrast. Multiplanar CT image reconstructions of the cervical spine and maxillofacial structures were also generated. COMPARISON:  Maxillofacial CT, 06/26/2013. FINDINGS: CT HEAD FINDINGS Brain: No evidence of acute infarction, hemorrhage, hydrocephalus, extra-axial collection or mass lesion/mass effect. Vascular: No hyperdense vessel or unexpected calcification. Skull: Normal. Negative for fracture or focal lesion. Other: None. CT MAXILLOFACIAL FINDINGS Osseous: Mildly depressed fracture of the left orbital floor, also mildly comminuted. Fracture fragments are depressed a maximum 5 mm. Extraconal overall flat enters the fracture defect. There is no extraocular muscle entrapment. Acute nondisplaced left nasal bone fracture. Additional old nasal fractures stable from the CT dated 06/26/2013. No other fractures.  No bone lesions. Orbits: No injury to either globe. No abnormality of the postseptal orbits. Sinuses: Dependent fluid consistent with hemorrhage in  the left maxillary sinus. Remaining sinuses are clear. Clear mastoid air cells and middle ear cavities. Soft tissues: Soft tissue swelling extends across the left nose to the left inferomedial preseptal periorbital soft tissues and across the left cheek. CT CERVICAL SPINE FINDINGS Alignment: Reversal of the normal cervical lordosis, apex at C5. No  spondylolisthesis. Skull base and vertebrae: No acute fracture. No primary bone lesion or focal pathologic process. Soft tissues and spinal canal: No prevertebral fluid or swelling. No visible canal hematoma. Disc levels: Mild loss of disc height at C4-C5. Moderate loss of disc height at C5-C6 and C6-C7. Disc bulging and endplate spurring noted at C5-C6 and C6-C7. There are facet degenerative changes, greatest on the right at C3-C4 and C4-C5 and on the left at C2-C3. No convincing disc herniation. Upper chest: Negative. Other: None. IMPRESSION: HEAD CT 1. No intracranial abnormality.  No skull fracture. MAXILLOFACIAL CT 1. Left orbital floor fracture depressed 5 mm. No evidence of extraocular muscle entrapment. 2. Nondisplaced/depressed left nasal fracture superimposed on chronic nasal fractures. CERVICAL CT 1. No fracture or acute finding. Electronically Signed   By: Lajean Manes M.D.   On: 06/18/2021 12:41   CT CERVICAL SPINE WO CONTRAST  Result Date: 06/18/2021 CLINICAL DATA:  Syncope and fall with facial injury. EXAM: CT HEAD WITHOUT CONTRAST CT MAXILLOFACIAL WITHOUT CONTRAST CT CERVICAL SPINE WITHOUT CONTRAST TECHNIQUE: Multidetector CT imaging of the head, cervical spine, and maxillofacial structures were performed using the standard protocol without intravenous contrast. Multiplanar CT image reconstructions of the cervical spine and maxillofacial structures were also generated. COMPARISON:  Maxillofacial CT, 06/26/2013. FINDINGS: CT HEAD FINDINGS Brain: No evidence of acute infarction, hemorrhage, hydrocephalus, extra-axial collection or mass lesion/mass  effect. Vascular: No hyperdense vessel or unexpected calcification. Skull: Normal. Negative for fracture or focal lesion. Other: None. CT MAXILLOFACIAL FINDINGS Osseous: Mildly depressed fracture of the left orbital floor, also mildly comminuted. Fracture fragments are depressed a maximum 5 mm. Extraconal overall flat enters the fracture defect. There is no extraocular muscle entrapment. Acute nondisplaced left nasal bone fracture. Additional old nasal fractures stable from the CT dated 06/26/2013. No other fractures.  No bone lesions. Orbits: No injury to either globe. No abnormality of the postseptal orbits. Sinuses: Dependent fluid consistent with hemorrhage in the left maxillary sinus. Remaining sinuses are clear. Clear mastoid air cells and middle ear cavities. Soft tissues: Soft tissue swelling extends across the left nose to the left inferomedial preseptal periorbital soft tissues and across the left cheek. CT CERVICAL SPINE FINDINGS Alignment: Reversal of the normal cervical lordosis, apex at C5. No spondylolisthesis. Skull base and vertebrae: No acute fracture. No primary bone lesion or focal pathologic process. Soft tissues and spinal canal: No prevertebral fluid or swelling. No visible canal hematoma. Disc levels: Mild loss of disc height at C4-C5. Moderate loss of disc height at C5-C6 and C6-C7. Disc bulging and endplate spurring noted at C5-C6 and C6-C7. There are facet degenerative changes, greatest on the right at C3-C4 and C4-C5 and on the left at C2-C3. No convincing disc herniation. Upper chest: Negative. Other: None. IMPRESSION: HEAD CT 1. No intracranial abnormality.  No skull fracture. MAXILLOFACIAL CT 1. Left orbital floor fracture depressed 5 mm. No evidence of extraocular muscle entrapment. 2. Nondisplaced/depressed left nasal fracture superimposed on chronic nasal fractures. CERVICAL CT 1. No fracture or acute finding. Electronically Signed   By: Lajean Manes M.D.   On: 06/18/2021 12:41    CT Maxillofacial Wo Contrast  Result Date: 06/18/2021 CLINICAL DATA:  Syncope and fall with facial injury. EXAM: CT HEAD WITHOUT CONTRAST CT MAXILLOFACIAL WITHOUT CONTRAST CT CERVICAL SPINE WITHOUT CONTRAST TECHNIQUE: Multidetector CT imaging of the head, cervical spine, and maxillofacial structures were performed using the standard protocol without intravenous contrast. Multiplanar CT image reconstructions of the cervical spine and maxillofacial structures were also  generated. COMPARISON:  Maxillofacial CT, 06/26/2013. FINDINGS: CT HEAD FINDINGS Brain: No evidence of acute infarction, hemorrhage, hydrocephalus, extra-axial collection or mass lesion/mass effect. Vascular: No hyperdense vessel or unexpected calcification. Skull: Normal. Negative for fracture or focal lesion. Other: None. CT MAXILLOFACIAL FINDINGS Osseous: Mildly depressed fracture of the left orbital floor, also mildly comminuted. Fracture fragments are depressed a maximum 5 mm. Extraconal overall flat enters the fracture defect. There is no extraocular muscle entrapment. Acute nondisplaced left nasal bone fracture. Additional old nasal fractures stable from the CT dated 06/26/2013. No other fractures.  No bone lesions. Orbits: No injury to either globe. No abnormality of the postseptal orbits. Sinuses: Dependent fluid consistent with hemorrhage in the left maxillary sinus. Remaining sinuses are clear. Clear mastoid air cells and middle ear cavities. Soft tissues: Soft tissue swelling extends across the left nose to the left inferomedial preseptal periorbital soft tissues and across the left cheek. CT CERVICAL SPINE FINDINGS Alignment: Reversal of the normal cervical lordosis, apex at C5. No spondylolisthesis. Skull base and vertebrae: No acute fracture. No primary bone lesion or focal pathologic process. Soft tissues and spinal canal: No prevertebral fluid or swelling. No visible canal hematoma. Disc levels: Mild loss of disc height at C4-C5.  Moderate loss of disc height at C5-C6 and C6-C7. Disc bulging and endplate spurring noted at C5-C6 and C6-C7. There are facet degenerative changes, greatest on the right at C3-C4 and C4-C5 and on the left at C2-C3. No convincing disc herniation. Upper chest: Negative. Other: None. IMPRESSION: HEAD CT 1. No intracranial abnormality.  No skull fracture. MAXILLOFACIAL CT 1. Left orbital floor fracture depressed 5 mm. No evidence of extraocular muscle entrapment. 2. Nondisplaced/depressed left nasal fracture superimposed on chronic nasal fractures. CERVICAL CT 1. No fracture or acute finding. Electronically Signed   By: Lajean Manes M.D.   On: 06/18/2021 12:41    Procedures Procedures   Medications Ordered in ED Medications  Tdap (BOOSTRIX) injection 0.5 mL (0.5 mLs Intramuscular Given 06/18/21 1247)  sodium chloride 0.9 % bolus 1,000 mL (0 mLs Intravenous Stopped 06/18/21 1527)  ketorolac (TORADOL) 15 MG/ML injection 15 mg (15 mg Intravenous Given 06/18/21 1304)    ED Course  I have reviewed the triage vital signs and the nursing notes.  Pertinent labs & imaging results that were available during my care of the patient were reviewed by me and considered in my medical decision making (see chart for details).    MDM Rules/Calculators/A&P                          Patient is a healthy 55 year old female who presents for syncopal episode this morning.  She does not have a history of the same.  Her husband witnessed the event and states that the loss of consciousness lasted for seconds and patient had return to mental baseline in approximately 1 minutes.  Patient did strike her face on a carpeted floor during the fall.  She does endorse facial pain as well as fatigue.  She denies any other current symptoms.  She does not remember any preceding symptoms prior to the syncopal episode.  On exam, she does have a skin tear to her left cheek.  She has no focal neurologic deficits.  Her vital signs are normal.   EKG shows normal sinus rhythm with narrow QRS.  Patient underwent imaging of head, face, and cervical spine to assess for injuries from the fall.  Tetanus was updated.  Given  her late night of drinking, patient likely dehydrated.  Bolus of IV fluids was given.  Following negative CT head, Toradol was given for analgesia.  CT imaging of face did show a left inferior orbital wall fracture without muscle entrapment.  Patient also has a nondisplaced nasal fracture.  Work-up shows normal hemoglobin, normal electrolytes, and normal troponin.  Skin tear on left cheek was cleansed with normal saline.  It was dressed with Xeroform and Tegaderm.  Following IV fluids, patient had no symptoms with standing or walking.  Test for orthostatic hypotension was negative.  She was advised to return to the ED if she does experience any further symptoms.  She was discharged in stable condition.  Final Clinical Impression(s) / ED Diagnoses Final diagnoses:  Syncope and collapse    Rx / DC Orders ED Discharge Orders     None        Godfrey Pick, MD 06/20/21 [phone]

## 2021-06-21 ENCOUNTER — Encounter: Payer: Self-pay | Admitting: Radiology

## 2021-06-21 ENCOUNTER — Ambulatory Visit: Payer: BC Managed Care – PPO | Admitting: Cardiology

## 2021-06-21 ENCOUNTER — Ambulatory Visit (INDEPENDENT_AMBULATORY_CARE_PROVIDER_SITE_OTHER): Payer: BC Managed Care – PPO

## 2021-06-21 ENCOUNTER — Encounter: Payer: Self-pay | Admitting: Cardiology

## 2021-06-21 ENCOUNTER — Other Ambulatory Visit: Payer: Self-pay

## 2021-06-21 VITALS — BP 114/68 | HR 67 | Ht 64.0 in | Wt 150.4 lb

## 2021-06-21 DIAGNOSIS — Z8249 Family history of ischemic heart disease and other diseases of the circulatory system: Secondary | ICD-10-CM

## 2021-06-21 DIAGNOSIS — R55 Syncope and collapse: Secondary | ICD-10-CM | POA: Diagnosis not present

## 2021-06-21 MED ORDER — METOPROLOL TARTRATE 50 MG PO TABS: 50.0000 mg | ORAL_TABLET | Freq: Once | ORAL | 0 refills | Status: DC

## 2021-06-21 NOTE — Patient Instructions (Addendum)
Medication Instructions:  Your physician recommends that you continue on your current medications as directed. Please refer to the Current Medication list given to you today.  *If you need a refill on your cardiac medications before your next appointment, please call your pharmacy*  Testing/Procedures: Your physician has requested that you have an echocardiogram. Echocardiography is a painless test that uses sound waves to create images of your heart. It provides your doctor with information about the size and shape of your heart and how well your heart's chambers and valves are working. This procedure takes approximately one hour. There are no restrictions for this procedure.  Your physician has requested that you have a coronary CTA scan. Please see below for further instructions.   Your physician has recommended that you wear an event monitor. Event monitors are medical devices that record the heart's electrical activity. Doctors most often [place] these monitors to diagnose arrhythmias. Arrhythmias are problems with the speed or rhythm of the heartbeat. The monitor is a small, portable device. You can wear one while you do your normal daily activities. This is usually used to diagnose what is causing palpitations/syncope (passing out).  Follow-Up: At [facility], you and your health needs are our priority.  As part of our continuing mission to provide you with exceptional heart care, we have created designated Provider Care Teams.  These Care Teams include your primary Cardiologist (physician) and Advanced Practice Providers (APPs -  Physician Assistants and Nurse Practitioners) who all work together to provide you with the care you need, when you need it.  Your next appointment:     The format for your next appointment:     Provider:      Other Instructions    Your cardiac CT will be scheduled at one of the below locations:   [facility] [address] [place],  [place] [address] [phone]  If scheduled at [facility], please arrive at the [facility] main entrance (entrance A) of [facility] 30 minutes prior to test start time. You can use the FREE valet parking offered at the main entrance (encouraged to control the heart rate for the test) Proceed to the [facility] Radiology Department (first floor) to check-in and test prep.  Please follow these instructions carefully (unless otherwise directed):  On the Night Before the Test: Be sure to Drink plenty of water. Do not consume any caffeinated/decaffeinated beverages or chocolate 12 hours prior to your test. Do not take any antihistamines 12 hours prior to your test.  On the Day of the Test: Drink plenty of water until 1 hour prior to the test. Do not eat any food 4 hours prior to the test. You may take your regular medications prior to the test.  Take metoprolol (Lopressor) two hours prior to test. FEMALES- please wear underwire-free bra if available, avoid dresses & tight clothing  After the Test: Drink plenty of water. After receiving IV contrast, you may experience a mild flushed feeling. This is normal. On occasion, you may experience a mild rash up to 24 hours after the test. This is not dangerous. If this occurs, you can take Benadryl 25 mg and increase your fluid intake. If you experience trouble breathing, this can be serious. If it is severe call 911 IMMEDIATELY. If it is mild, please call our office. If you take any of these medications: Glipizide/Metformin, Avandament, Glucavance, please do not take 48 hours after completing test unless otherwise instructed.  Please allow  2-4 weeks for scheduling of routine cardiac CTs. Some insurance companies require a pre-authorization which may delay scheduling of this test.   For non-scheduling related questions, please contact the cardiac imaging nurse navigator should you have any questions/concerns: Rockwell Alexandria, Cardiac  Imaging Nurse Navigator Larey Brick, Cardiac Imaging Nurse Navigator [facility] Direct Office Dial: [phone]   For scheduling needs, including cancellations and rescheduling, please call [place], [phone].   [employer] Cardiac Event Monitor Instructions Your physician has requested you wear your cardiac event monitor for _30_ days, (1-30). [employer] may call or text to confirm a shipping address. The monitor will be sent to a land address via UPS. [employer] will not ship a monitor to a PO BOX. It typically takes 3-5 days to receive your monitor after it has been enrolled. [employer] will assist with USPS tracking if your package is delayed. The telephone number for [employer] is [phone]. Once you have received your monitor, please review the enclosed instructions. Instruction tutorials can also be viewed under help and settings on the enclosed cell phone. Your monitor has already been registered assigning a specific monitor serial # to you.  Applying the monitor Remove cell phone from case and turn it on. The cell phone works as IT consultant and needs to be within [employer] of you at all times. The cell phone will need to be charged on a daily basis. We recommend you plug the cell phone into the enclosed charger at your bedside table every night.  Monitor batteries: You will receive two monitor batteries labelled #1 and #2. These are your recorders. Plug battery #2 onto the second connection on the enclosed charger. Keep one battery on the charger at all times. This will keep the monitor battery deactivated. It will also keep it fully charged for when you need to switch your monitor batteries. A small light will be blinking on the battery emblem when it is charging. The light on the battery emblem will remain on when the battery is fully charged.  Open package of a Monitor strip. Insert battery #1 into black hood on strip and gently  squeeze monitor battery onto connection as indicated in instruction booklet. Set aside while preparing skin.  Choose location for your strip, vertical or horizontal, as indicated in the instruction booklet. Shave to remove all hair from location. There cannot be any lotions, oils, powders, or colognes on skin where monitor is to be applied. Wipe skin clean with enclosed Saline wipe. Dry skin completely.  Peel paper labeled #1 off the back of the Monitor strip exposing the adhesive. Place the monitor on the chest in the vertical or horizontal position shown in the instruction booklet. One arrow on the monitor strip must be pointing upward. Carefully remove paper labeled #2, attaching remainder of strip to your skin. Try not to create any folds or wrinkles in the strip as you apply it.  Firmly press and release the circle in the center of the monitor battery. You will hear a small beep. This is turning the monitor battery on. The heart emblem on the monitor battery will light up every 5 seconds if the monitor battery in turned on and connected to the patient securely. Do not push and hold the circle down as this turns the monitor battery off. The cell phone will locate the monitor battery. A screen will appear on the cell phone checking the connection of your monitor strip. This may read poor connection initially but change to  good connection within the next minute. Once your monitor accepts the connection you will hear a series of 3 beeps followed by a climbing crescendo of beeps. A screen will appear on the cell phone showing the two monitor strip placement options. Touch the picture that demonstrates where you applied the monitor strip.  Your monitor strip and battery are waterproof. You are able to shower, bathe, or swim with the monitor on. They just ask you do not submerge deeper than 3 feet underwater. We recommend removing the monitor if you are swimming in a lake, river, or  ocean.  Your monitor battery will need to be switched to a fully charged monitor battery approximately once a week. The cell phone will alert you of an action which needs to be made.  On the cell phone, tap for details to reveal connection status, monitor battery status, and cell phone battery status. The green dots indicates your monitor is in good status. A red dot indicates there is something that needs your attention.  To record a symptom, click the circle on the monitor battery. In 30-60 seconds a list of symptoms will appear on the cell phone. Select your symptom and tap save. Your monitor will record a sustained or significant arrhythmia regardless of you clicking the button. Some patients do not feel the heart rhythm irregularities. [employer] will notify us of any serious or critical events.  Refer to instruction booklet for instructions on switching batteries, changing strips, the Do not disturb or Pause features, or any additional questions.  Call [employer] at [phone], to confirm your monitor is transmitting and record your baseline. They will answer any questions you may have regarding the monitor instructions at that time.  Returning the monitor to [employer] Place all equipment back into blue box. Peel off strip of paper to expose adhesive and close box securely. There is a prepaid UPS shipping label on this box. Drop in a UPS drop box, or at a UPS facility like [employer]. You may also contact [employer] to arrange UPS to pick up monitor package at your home.

## 2021-06-21 NOTE — Progress Notes (Signed)
Enrolled patient for a 30 day [employer] Event Monitor to be mailed to patients home  

## 2021-06-21 NOTE — Progress Notes (Signed)
Cardiology Consult  Note    Date:  06/21/2021   ID:  Kathy [name], DOB [birth year], MRN [id number]  PCP:  Richardean Chimera, MD  Cardiologist:  Armanda Magic, MD   Chief Complaint  Patient presents with   Follow-up    Syncope and fm hx of CAD      History of Present Illness:  Kathy [name] is a 55 y.o. female with no significant past medical history but has a strong family history of CAD on both her mother and father side of the family.  Her father had an MI in his 31's and her brother had an MI at 67.  She was initially seen by me over 3 years ago for cardiac workup.  This showed a coronary Ca score of 0 and stress echo showed no ischemia.   She was in her usual state of health until a few Days ago when she presented to the emergency room after syncopal episode.  She apparently was getting up out of bed around 9 AM and she stood up and walked to the edge of the bed and then fell forward striking her face.  Her husband witnessed the fall and set her up and states that her eyes were open but she was not responding to him for several seconds.  Following that she was awake but seemed confused for at least a minute.  The patient does not remember the event.  There was no witnessed seizure activity and she has no history of seizures or syncope in the past.  She had had alcohol the night prior and did not get until early hours in the morning.  Work-up in the ER showed normal troponin at 5 and 4, serum creatinine 0.61, potassium 3.8, magnesium 1.9 and normal CBC.  CT was negative and maxillofacial CT showed a nondisplaced depressed left nasal fracture superimposed on chronic nasal fractures as well as left orbital floor fracture depressed 5 mm.  She is now referred for further evaluation.  She is here today for followup and is doing well.  She denies any chest pain or pressure, SOB, DOE, PND, orthopnea, LE edema or palpitations. She is compliant with her meds and is tolerating meds with no SE.      Past Medical History:  Diagnosis Date   ACL (anterior cruciate ligament) tear    Acute meniscal tear of left knee    OA (osteoarthritis) of knee    LEFT    Past Surgical History:  Procedure Laterality Date   BREAST BIOPSY     CLOSED REDUCTION NASAL FRACTURE N/A 07/29/2013   Procedure:  OPEN REPAIR OF SEPTAL NASAL FRACTURE;  Surgeon: Wayland Denis, DO;  Location: [facility];  Service: Plastics;  Laterality: N/A;   KNEE ARTHROSCOPY WITH ANTERIOR CRUCIATE LIGAMENT (ACL) REPAIR WITH HAMSTRING GRAFT Left 09/11/2013   Procedure: LEFT KNEE ARTHROSCOPY WITH ALLOGRAFT ANTERIOR CRUCIATE LIGAMENT (ACL) RECONSTRUCTION PARTIAL MENISCECTOMY  CHONDROPLASTY ;  Surgeon: Eugenia Mcalpine, MD;  Location: [facility];  Service: Orthopedics;  Laterality: Left;   TONSILLECTOMY  1996   TUBAL LIGATION  2007    Current Medications: Current Meds  Medication Sig   estradiol (VIVELLE-DOT) 0.05 MG/24HR patch 1 patch 2 (two) times a week.   FLUoxetine (PROZAC) 20 MG capsule Take 20 mg by mouth daily.   progesterone (PROMETRIUM) 100 MG capsule Take 100 mg by mouth at bedtime.   zolpidem (AMBIEN) 10 MG tablet Take 5 mg by mouth at bedtime as needed  for sleep.    Allergies:   Patient has no known allergies.   Social History   Socioeconomic History   Marital status: Married    Spouse name: Not on file   Number of children: Not on file   Years of education: Not on file   Highest education level: Not on file  Occupational History   Not on file  Tobacco Use   Smoking status: Never   Smokeless tobacco: Never  Substance and Sexual Activity   Alcohol use: Yes    Comment: OCCASIONAL   Drug use: No   Sexual activity: Not on file  Other Topics Concern   Not on file  Social History Narrative   Not on file   Social Determinants of Health   Financial Resource Strain: Not on file  Food Insecurity: Not on file  Transportation Needs: Not on file  Physical Activity: Not on file   Stress: Not on file  Social Connections: Not on file     Family History:  The patient's family history includes Cancer in her father; Heart attack (age of onset: 52) in her brother; Heart block in her father; Heart disease (age of onset: 2) in her father; Heart failure in her maternal grandfather, maternal grandmother, paternal grandfather, and paternal grandmother.   ROS:   Please see the history of present illness.    ROS All other systems reviewed and are negative.  No flowsheet data found.   PHYSICAL EXAM:   VS:  BP 114/68 (BP Location: Right Arm, Cuff Size: Normal)   Pulse 67   Ht 5\' 4"  (1.626 m)   Wt 150 lb 6.4 oz (68.2 kg)   SpO2 99%   BMI 25.82 kg/m    Orthostatic VS for the past 24 hrs (Last 3 readings):  BP- Lying Pulse- Lying BP- Sitting Pulse- Sitting BP- Standing at 0 minutes Pulse- Standing at 0 minutes BP- Standing at 3 minutes Pulse- Standing at 3 minutes  06/21/21 1340 114/68 67 116/68 67 117/74 67 118/67 67     GEN: Well nourished, well developed in no acute distress HEENT: Normal NECK: No JVD; No carotid bruits LYMPHATICS: No lymphadenopathy CARDIAC:RRR, no murmurs, rubs, gallops RESPIRATORY:  Clear to auscultation without rales, wheezing or rhonchi  ABDOMEN: Soft, non-tender, non-distended MUSCULOSKELETAL:  No edema; No deformity  SKIN: Warm and dry NEUROLOGIC:  Alert and oriented x 3 PSYCHIATRIC:  Normal affect   Wt Readings from Last 3 Encounters:  06/21/21 150 lb 6.4 oz (68.2 kg)  02/10/21 145 lb 12.8 oz (66.1 kg)  09/26/17 157 lb (71.2 kg)      Studies/Labs Reviewed:   EKG:  EKG is not ordered today.    Recent Labs: 06/18/2021: BUN 7; Creatinine, Ser 0.61; Hemoglobin 13.5; Magnesium 1.9; Platelets 296; Potassium 3.8; Sodium 138   Lipid Panel No results found for: CHOL, TRIG, HDL, CHOLHDL, VLDL, LDLCALC, LDLDIRECT  Additional studies/ records that were reviewed today include:  none    ASSESSMENT:    1. Syncope and collapse   2.  Family history of early CAD      PLAN:  In order of problems listed above:  1.  Syncope -Suspect this was orthostatic in nature as she says she had had a busy few days getting ready for her daughter's Deb ball with the lot of family and friends in town and she does not think she was staying hydrated .  By history it does not sound like seizures.  I am concerned about the  significant injury she sustained and also feel like we need to consider primary arrhythmia -Echo in 2019 was normal and coronary calcium score was 0 -I will repeat a 2D echocardiogram to make sure there have been no structural changes to her heart and her LV function is normal -We will get a 30-day event monitor to rule out arrhythmia and if this is negative consider a loop recorder given the significant injury she sustained with a syncopal episode -Encouraged her for now to avoid caffeine and alcohol and make sure she drinks at least 64 ounces of fluid daily -Orthostatics are normal on exam today -I also instructed her that for now recommend no driving until the studies are completed  2.  Family history of premature CAD/Chest pain - she has a history of early CAD on both her maternal and paternal sides of the family. Her father had an MI at 70 and her brother had an MI at 37.    -She was exposed to second had smoke as a child.   -stress echo in 2019 showed no ischemia and normal LVF -coronary Ca score was 0 -EKG done in the ER recently showed normal sinus rhythm with nonspecific T wave abnormality -Had several episodes of vague chest discomfort recently that are nonexertional. -I will get a coronary CTA to define coronary anatomy since she has been having some nonspecific chest pain recently and has a very strong family history of CAD and now with a recent syncopal episode I think we need to rule out coronary ischemia  Medication Adjustments/Labs and Tests Ordered: Current medicines are reviewed at length with the patient  today.  Concerns regarding medicines are outlined above.  Medication changes, Labs and Tests ordered today are listed in the Patient Instructions below.  There are no Patient Instructions on file for this visit.   Signed, Armanda Magic, MD  06/21/2021 1:58 PM    [facility] [address] [place], [place], [place]  [address] Phone: [phone]; Fax: [phone]

## 2021-06-22 DIAGNOSIS — S022XXA Fracture of nasal bones, initial encounter for closed fracture: Secondary | ICD-10-CM | POA: Diagnosis not present

## 2021-06-22 DIAGNOSIS — S0232XA Fracture of orbital floor, left side, initial encounter for closed fracture: Secondary | ICD-10-CM | POA: Diagnosis not present

## 2021-06-27 ENCOUNTER — Ambulatory Visit (HOSPITAL_BASED_OUTPATIENT_CLINIC_OR_DEPARTMENT_OTHER): Payer: BC Managed Care – PPO

## 2021-06-27 ENCOUNTER — Telehealth (HOSPITAL_COMMUNITY): Payer: Self-pay | Admitting: *Deleted

## 2021-06-27 ENCOUNTER — Other Ambulatory Visit: Payer: Self-pay

## 2021-06-27 DIAGNOSIS — R55 Syncope and collapse: Secondary | ICD-10-CM | POA: Diagnosis not present

## 2021-06-27 DIAGNOSIS — Z8249 Family history of ischemic heart disease and other diseases of the circulatory system: Secondary | ICD-10-CM | POA: Diagnosis not present

## 2021-06-27 LAB — ECHOCARDIOGRAM COMPLETE
Area-P 1/2: 3.36 cm2
S' Lateral: 3.1 cm

## 2021-06-27 NOTE — Telephone Encounter (Signed)
Reaching out to patient to offer assistance regarding upcoming cardiac imaging study; pt verbalizes understanding of appt date/time, parking situation and where to check in, pre-test NPO status and medications ordered, and verified current allergies; name and call back number provided for further questions should they arise  Kathy Brick RN Navigator Cardiac Imaging [facility] [phone] office [phone] cell  Patient to take 50mg  metoprolol tartrate two hours prior to cardiac CT scan. She is aware to arrive at 11am for her 11:30am scan.

## 2021-06-28 ENCOUNTER — Ambulatory Visit (HOSPITAL_COMMUNITY)
Admission: RE | Admit: 2021-06-28 | Discharge: 2021-06-28 | Disposition: A | Discharge status: Home / Self Care | Payer: BC Managed Care – PPO | Source: Ambulatory Visit | Attending: Cardiology | Admitting: Cardiology

## 2021-06-28 DIAGNOSIS — Z8249 Family history of ischemic heart disease and other diseases of the circulatory system: Secondary | ICD-10-CM | POA: Insufficient documentation

## 2021-06-28 DIAGNOSIS — H40032 Anatomical narrow angle, left eye: Secondary | ICD-10-CM | POA: Diagnosis not present

## 2021-06-28 DIAGNOSIS — R55 Syncope and collapse: Secondary | ICD-10-CM | POA: Diagnosis not present

## 2021-06-28 DIAGNOSIS — S0232XA Fracture of orbital floor, left side, initial encounter for closed fracture: Secondary | ICD-10-CM | POA: Diagnosis not present

## 2021-06-28 DIAGNOSIS — H5203 Hypermetropia, bilateral: Secondary | ICD-10-CM | POA: Diagnosis not present

## 2021-06-28 DIAGNOSIS — R079 Chest pain, unspecified: Secondary | ICD-10-CM | POA: Diagnosis not present

## 2021-06-28 MED ORDER — IOHEXOL 350 MG/ML SOLN
95.0000 mL | Freq: Once | INTRAVENOUS | Status: AC | PRN
  Administered 2021-06-28: 95 mL via INTRAVENOUS

## 2021-06-28 MED ORDER — NITROGLYCERIN 0.4 MG SL SUBL
0.4000 mg | SUBLINGUAL_TABLET | SUBLINGUAL | Status: DC | PRN
  Administered 2021-06-28: 0.8 mg via SUBLINGUAL

## 2021-07-11 DIAGNOSIS — R55 Syncope and collapse: Secondary | ICD-10-CM | POA: Diagnosis not present

## 2021-07-11 DIAGNOSIS — Z8249 Family history of ischemic heart disease and other diseases of the circulatory system: Secondary | ICD-10-CM | POA: Diagnosis not present

## 2021-07-25 ENCOUNTER — Telehealth: Payer: Self-pay | Admitting: Cardiology

## 2021-07-25 NOTE — Telephone Encounter (Signed)
LVM advising pt that appt on 08/02/21 has been switched to virtual. Will send reminder letter and MyChart message °

## 2021-08-02 ENCOUNTER — Other Ambulatory Visit: Payer: Self-pay

## 2021-08-02 ENCOUNTER — Encounter: Payer: BC Managed Care – PPO | Admitting: Cardiology

## 2021-08-02 DIAGNOSIS — L0100 Impetigo, unspecified: Secondary | ICD-10-CM | POA: Diagnosis not present

## 2021-08-02 NOTE — Progress Notes (Signed)
This encounter was created in error - please disregard.

## 2021-08-04 ENCOUNTER — Emergency Department (HOSPITAL_COMMUNITY)
Admission: EM | Admit: 2021-08-04 | Discharge: 2021-08-05 | Disposition: A | Discharge status: Home / Self Care | Payer: BC Managed Care – PPO | Attending: Emergency Medicine | Admitting: Emergency Medicine

## 2021-08-04 ENCOUNTER — Other Ambulatory Visit: Payer: Self-pay

## 2021-08-04 ENCOUNTER — Encounter (HOSPITAL_COMMUNITY): Payer: Self-pay | Admitting: *Deleted

## 2021-08-04 DIAGNOSIS — S022XXA Fracture of nasal bones, initial encounter for closed fracture: Secondary | ICD-10-CM | POA: Diagnosis not present

## 2021-08-04 DIAGNOSIS — L02811 Cutaneous abscess of head [any part, except face]: Secondary | ICD-10-CM | POA: Diagnosis not present

## 2021-08-04 DIAGNOSIS — L0201 Cutaneous abscess of face: Secondary | ICD-10-CM | POA: Diagnosis not present

## 2021-08-04 DIAGNOSIS — M7989 Other specified soft tissue disorders: Secondary | ICD-10-CM | POA: Diagnosis not present

## 2021-08-04 DIAGNOSIS — L0291 Cutaneous abscess, unspecified: Secondary | ICD-10-CM

## 2021-08-04 DIAGNOSIS — J340 Abscess, furuncle and carbuncle of nose: Secondary | ICD-10-CM | POA: Diagnosis not present

## 2021-08-04 DIAGNOSIS — S0232XA Fracture of orbital floor, left side, initial encounter for closed fracture: Secondary | ICD-10-CM | POA: Diagnosis not present

## 2021-08-04 LAB — BASIC METABOLIC PANEL
Anion gap: 9 (ref 5–15)
BUN: 11 mg/dL (ref 6–20)
CO2: 25 mmol/L (ref 22–32)
Calcium: 9.2 mg/dL (ref 8.9–10.3)
Chloride: 105 mmol/L (ref 98–111)
Creatinine, Ser: 0.75 mg/dL (ref 0.44–1.00)
GFR, Estimated: >60 mL/min (ref >60)
Glucose, Bld: 99 mg/dL (ref 70–99)
Potassium: 3.8 mmol/L (ref 3.5–5.1)
Sodium: 139 mmol/L (ref 135–145)

## 2021-08-04 LAB — CBC WITH DIFFERENTIAL/PLATELET
Abs Immature Granulocytes: 0.03 10*3/uL (ref 0.00–0.07)
Basophils Absolute: 0.1 10*3/uL (ref 0.0–0.1)
Basophils Relative: 1 %
Eosinophils Absolute: 0 10*3/uL (ref 0.0–0.5)
Eosinophils Relative: 1 %
HCT: 40.6 % (ref 36.0–46.0)
Hemoglobin: 13.9 g/dL (ref 12.0–15.0)
Immature Granulocytes: 0 %
Lymphocytes Relative: 29 %
Lymphs Abs: 2 10*3/uL (ref 0.7–4.0)
MCH: 33.4 pg (ref 26.0–34.0)
MCHC: 34.2 g/dL (ref 30.0–36.0)
MCV: 97.6 fL (ref 80.0–100.0)
Monocytes Absolute: 0.7 10*3/uL (ref 0.1–1.0)
Monocytes Relative: 11 %
Neutro Abs: 4.1 10*3/uL (ref 1.7–7.7)
Neutrophils Relative %: 58 %
Platelets: 312 10*3/uL (ref 150–400)
RBC: 4.16 MIL/uL (ref 3.87–5.11)
RDW: 12.3 % (ref 11.5–15.5)
WBC: 7 10*3/uL (ref 4.0–10.5)
nRBC: 0 % (ref 0.0–0.2)

## 2021-08-04 NOTE — ED Provider Triage Note (Signed)
Emergency Medicine Provider Triage Evaluation Note  Kathy [name] , a 56 y.o. female  was evaluated in triage.  Pt complains of  R nostril pain and swelling and redness. States she was was seen by dermatology on Wednesday and started on doxycycline.  She states that yesterday she had "fever "but this was an otic temperature of 100.  She states that she feels relatively well.  She called dermatology office today and was told to go immediately to the emergency room.  Seems that this is day 3 of her antibiotics.  She states that she has had some worsening in the swelling in her nostril.  Review of Systems  Positive: Right nostril pain Negative: Vomiting  Physical Exam  BP 116/81 (BP Location: Right Arm)    Pulse 69    Temp 98.7 F (37.1 C) (Oral)    Resp 16    SpO2 97%  Gen:   Awake, no distress   Resp:  Normal effort  MSK:   Moves extremities without difficulty  Other:  Right nostril with evidence of impetigo with some yellow crusting and swelling.  It is tender to touch  Medical Decision Making  Medically screening exam initiated at 12:57 PM.  Appropriate orders placed.  Kathy [name] was informed that the remainder of the evaluation will be completed by another provider, this initial triage assessment does not replace that evaluation, and the importance of remaining in the ED until their evaluation is complete.  Will obtain basic labs.  Will need assessment in room with better lighting for evaluation of whether there is an abscess present.    Gailen Shelter, [place] 08/04/21 1300

## 2021-08-04 NOTE — ED Triage Notes (Signed)
Pt has bump on right nostril that has gotten bigger and more painful. Had culture done but no results yet. Was told to come in due to fever and headache.

## 2021-08-05 ENCOUNTER — Emergency Department (HOSPITAL_COMMUNITY): Payer: BC Managed Care – PPO

## 2021-08-05 DIAGNOSIS — J340 Abscess, furuncle and carbuncle of nose: Secondary | ICD-10-CM | POA: Diagnosis not present

## 2021-08-05 DIAGNOSIS — S0232XA Fracture of orbital floor, left side, initial encounter for closed fracture: Secondary | ICD-10-CM | POA: Diagnosis not present

## 2021-08-05 DIAGNOSIS — M7989 Other specified soft tissue disorders: Secondary | ICD-10-CM | POA: Diagnosis not present

## 2021-08-05 DIAGNOSIS — S022XXA Fracture of nasal bones, initial encounter for closed fracture: Secondary | ICD-10-CM | POA: Diagnosis not present

## 2021-08-05 MED ORDER — IOHEXOL 350 MG/ML SOLN
80.0000 mL | Freq: Once | INTRAVENOUS | Status: AC | PRN
  Administered 2021-08-05: 80 mL via INTRAVENOUS

## 2021-08-05 MED ORDER — SULFAMETHOXAZOLE-TRIMETHOPRIM 800-160 MG PO TABS: 1.0000 | ORAL_TABLET | Freq: Two times a day (BID) | ORAL | 0 refills | Status: AC

## 2021-08-05 MED ORDER — LIDOCAINE HCL (PF) 1 % IJ SOLN
2.0000 mL | Freq: Once | INTRAMUSCULAR | Status: AC
  Administered 2021-08-05: 2 mL
  Filled 2021-08-05: qty 5

## 2021-08-05 NOTE — ED Notes (Signed)
Bandage applied to wound. Patient verbalizes understanding of discharge instructions. Opportunity for questioning and answers were provided. Armband removed by staff, pt discharged from ED. Ambulated out to lobby

## 2021-08-05 NOTE — ED Notes (Addendum)
Pt states she had a culture of R nare wound at her dermatology office Wednesday, results still in process. She also states she's been taking Doxycycline since that day. Reports more drainage and pain relief of the wound today

## 2021-08-05 NOTE — ED Provider Notes (Addendum)
[facility] Provider Note MRN:  [id number]  Arrival date & time: 08/05/21     Chief Complaint   Wound Infection   History of Present Illness   Kathy [name] is a 56 y.o. year-old female with no significant past medical history presenting to the ED with chief complaint of wound infection.  Pain and wound to the entrance of the right nare, present for a few days.  Getting progressively worse.  Associated with fever a few days ago as well as headache surrounding the right periorbital region.  Headache is largely resolved, wound seems to be draining.  Review of Systems  A thorough review of systems was obtained and all systems are negative except as noted in the HPI and PMH.   Patient's Health History    Past Medical History:  Diagnosis Date   ACL (anterior cruciate ligament) tear    Acute meniscal tear of left knee    OA (osteoarthritis) of knee    LEFT    Past Surgical History:  Procedure Laterality Date   BREAST BIOPSY     CLOSED REDUCTION NASAL FRACTURE N/A 07/29/2013   Procedure:  OPEN REPAIR OF SEPTAL NASAL FRACTURE;  Surgeon: Wayland Denislaire [name], DO;  Location: [facility];  Service: Plastics;  Laterality: N/A;   KNEE ARTHROSCOPY WITH ANTERIOR CRUCIATE LIGAMENT (ACL) REPAIR WITH HAMSTRING GRAFT Left 09/11/2013   Procedure: LEFT KNEE ARTHROSCOPY WITH ALLOGRAFT ANTERIOR CRUCIATE LIGAMENT (ACL) RECONSTRUCTION PARTIAL MENISCECTOMY  CHONDROPLASTY ;  Surgeon: Eugenia Mcalpineobert Collins, MD;  [facility] [place];  Service: Orthopedics;  Laterality: Left;   TONSILLECTOMY  1996   TUBAL LIGATION  2007    Family History  Problem Relation Age of Onset   Cancer Father    Heart block Father    Heart disease Father 1850   Heart failure Maternal Grandmother    Heart failure Maternal Grandfather    Heart failure Paternal Grandmother    Heart failure Paternal Grandfather    Heart attack Brother 1344       mi    Social History    Socioeconomic History   Marital status: Married    Spouse name: Not on file   Number of children: Not on file   Years of education: Not on file   Highest education level: Not on file  Occupational History   Not on file  Tobacco Use   Smoking status: Never   Smokeless tobacco: Never  Substance and Sexual Activity   Alcohol use: Yes    Comment: OCCASIONAL   Drug use: No   Sexual activity: Not on file  Other Topics Concern   Not on file  Social History Narrative   Not on file   Social Determinants of Health   Financial Resource Strain: Not on file  Food Insecurity: Not on file  Transportation Needs: Not on file  Physical Activity: Not on file  Stress: Not on file  Social Connections: Not on file  Intimate Partner Violence: Not on file     Physical Exam   Vitals:   08/05/21 0500 08/05/21 0607  BP: 138/78 121/84  Pulse: (!) 55 (!) 57  Resp: 16 12  Temp:  98.6 F (37 C)  SpO2: 98% 100%    CONSTITUTIONAL: Well-appearing, NAD NEURO/PSYCH:  Alert and oriented x 3, no focal deficits EYES:  eyes equal and reactive ENT/NECK:  no LAD, no JVD CARDIO: Regular rate, well-perfused, normal S1 and S2 PULM:  CTAB no wheezing or rhonchi  GI/GU:  non-distended, non-tender MSK/SPINE:  No gross deformities, no edema SKIN: Draining fluctuant nodule at the entrance of the right nare   *Additional and/or pertinent findings included in MDM below  Diagnostic and Interventional Summary    EKG Interpretation  Date/Time:    Ventricular Rate:    PR Interval:    QRS Duration:   QT Interval:    QTC Calculation:   R Axis:     Text Interpretation:         Labs Reviewed  CBC WITH DIFFERENTIAL/PLATELET  BASIC METABOLIC PANEL    CT Maxillofacial W Contrast  Final Result      Medications  iohexol (OMNIPAQUE) 350 MG/ML injection 80 mL (80 mLs Intravenous Contrast Given 08/05/21 0506)  lidocaine (PF) (XYLOCAINE) 1 % injection 2 mL (2 mLs Infiltration Given by Other 08/05/21  0548)     Procedures  /  Critical Care .Marland KitchenIncision and Drainage  Date/Time: 08/05/2021 6:04 AM Performed by: Sabas Sous, MD Authorized by: Sabas Sous, MD   Consent:    Consent obtained:  Verbal   Consent given by:  Patient   Risks discussed:  Bleeding, damage to other organs, infection, incomplete drainage and pain   Alternatives discussed:  No treatment Universal protocol:    Procedure explained and questions answered to patient or proxy's satisfaction: yes     Immediately prior to procedure, a time out was called: yes     Patient identity confirmed:  Verbally with patient Location:    Type:  Abscess   Size:  .6cm   Location:  Head   Head location:  Nose Pre-procedure details:    Skin preparation:  Chlorhexidine with alcohol Sedation:    Sedation type:  None Anesthesia:    Anesthesia method:  Local infiltration   Local anesthetic:  Lidocaine 1% w/o epi Procedure type:    Complexity:  Complex Procedure details:    Ultrasound guidance: no     Needle aspiration: no     Incision types:  Single straight   Incision depth:  Subcutaneous   Wound management:  Debrided   Drainage:  Purulent and bloody   Drainage amount:  Moderate   Wound treatment:  Wound left open   Packing materials:  None Post-procedure details:    Procedure completion:  Tolerated well, no immediate complications  ED Course and Medical Decision Making  Initial Impression and Ddx Suspect facial abscess but also considering deeper space infection given the fever and headache, such as cavernous sinuses thrombus or infection.  Will obtain CTA imaging.  Past medical/surgical history that increases complexity of ED encounter: None  Interpretation of Diagnostics Labs are reassuring, CT scan is without deeper space infection, there is a small abscess.  Patient Reassessment and Ultimate Disposition/Management See procedural details above, tolerated well, appropriate for discharge.  Patient  management required discussion with the following services or consulting groups:  None  Complexity of Problems Addressed Acute complicated illness or Injury  Additional Data Reviewed and Analyzed Further history obtained from: Recent PCP notes  Factors Impacting ED Encounter Risk Prescriptions and Minor Procedures  Elmer Sow. Pilar Plate, MD [facility] Emergency [facility] [email or web address]  Final Clinical Impressions(s) / ED Diagnoses     ICD-10-CM   1. Abscess  L02.91       ED Discharge Orders          Ordered    sulfamethoxazole-trimethoprim (BACTRIM DS) 800-160 MG tablet  2 times daily  08/05/21 0601             Discharge Instructions Discussed with and Provided to Patient:     Discharge Instructions      You were evaluated in the Emergency Department and after careful evaluation, we did not find any emergent condition requiring admission or further testing in the hospital.  Your exam/testing today is overall reassuring.  We drained your abscess here in the emergency department.  To give you the best chance of clearing the infection, stop the doxycycline and take the full course of Bactrim as directed.  Use Tylenol or Motrin for pain.  You may experience draining from the wound over the next few days.  Recommend Neosporin to help with scarring.  Please return to the Emergency Department if you experience any worsening of your condition.   Thank you for allowing [place] to be a part of your care.       Sabas Sous, MD 08/05/21 1610    Sabas Sous, MD 08/05/21 [phone]

## 2021-08-05 NOTE — Discharge Instructions (Signed)
You were evaluated in the Emergency Department and after careful evaluation, we did not find any emergent condition requiring admission or further testing in the hospital.  Your exam/testing today is overall reassuring.  We drained your abscess here in the emergency department.  To give you the best chance of clearing the infection, stop the doxycycline and take the full course of Bactrim as directed.  Use Tylenol or Motrin for pain.  You may experience draining from the wound over the next few days.  Recommend Neosporin to help with scarring.  Please return to the Emergency Department if you experience any worsening of your condition.   Thank you for allowing [place] to be a part of your care.

## 2021-08-17 DIAGNOSIS — J340 Abscess, furuncle and carbuncle of nose: Secondary | ICD-10-CM | POA: Diagnosis not present

## 2021-08-31 ENCOUNTER — Other Ambulatory Visit: Payer: Self-pay

## 2021-08-31 ENCOUNTER — Ambulatory Visit: Payer: BC Managed Care – PPO | Admitting: Cardiology

## 2021-08-31 ENCOUNTER — Encounter: Payer: Self-pay | Admitting: Cardiology

## 2021-08-31 VITALS — BP 112/76 | HR 81 | Ht 64.0 in | Wt 143.2 lb

## 2021-08-31 DIAGNOSIS — M25562 Pain in left knee: Secondary | ICD-10-CM | POA: Diagnosis not present

## 2021-08-31 DIAGNOSIS — Z8249 Family history of ischemic heart disease and other diseases of the circulatory system: Secondary | ICD-10-CM

## 2021-08-31 DIAGNOSIS — R55 Syncope and collapse: Secondary | ICD-10-CM

## 2021-08-31 NOTE — Progress Notes (Signed)
Cardiology Consult  Note    Date:  08/31/2021   ID:  Kathy [name], DOB 07/04/1966, MRN [id number]  PCP:  Melida Quitter, MD  Cardiologist:  Armanda Magic, MD   Chief Complaint  Patient presents with   Follow-up    Chest pain/syncope      History of Present Illness:  Kathy [name] is a 56 y.o. female with no significant past medical history but has a strong family history of CAD on both her mother and father side of the family.  Her father had an MI in his 16's and her brother had an MI at 51.  She was initially seen by me over 3 years ago for cardiac workup.  This showed a coronary Ca score of 0 and stress echo showed no ischemia.   She last presented a few months ago with an episode of syncope.  She apparently was getting up out of bed around 9 AM and she stood up and walked to the edge of the bed and then fell forward striking her face.  Her husband witnessed the fall and set her up and states that her eyes were open but she was not responding to him for several seconds.  Following that she was awake but seemed confused for at least a minute.   There was no witnessed seizure activity and she has no history of seizures or syncope in the past.  She had had alcohol the night prior and did not get until early hours in the morning.  Work-up in the ER showed normal troponin at 5 and 4, serum creatinine 0.61, potassium 3.8, magnesium 1.9 and normal CBC.  CT was negative and maxillofacial CT showed a nondisplaced depressed left nasal fracture superimposed on chronic nasal fractures as well as left orbital floor fracture depressed 5 mm.  She is now referred for further evaluation.  It was felt that likely she had orthostatic hypotension from not staying hydrated during the day and then having alcohol that night.  Event monitor was worn which showed no arrhythmias.  2D echo showed low normal LV function with EF 50 to 55% with normal LV global longitudinal strain at 25.1%.  Left atrium is mildly  dilated.  She is here today for followup and is doing well.  She denies any chest pain or pressure, SOB, DOE, PND, orthopnea, LE edema, dizziness, palpitations or syncope.  She is compliant with her meds and is tolerating meds with no SE.    Past Medical History:  Diagnosis Date   ACL (anterior cruciate ligament) tear    Acute meniscal tear of left knee    OA (osteoarthritis) of knee    LEFT    Past Surgical History:  Procedure Laterality Date   BREAST BIOPSY     CLOSED REDUCTION NASAL FRACTURE N/A 07/29/2013   Procedure:  OPEN REPAIR OF SEPTAL NASAL FRACTURE;  Surgeon: Wayland Denis, DO;  Location: [facility];  Service: Plastics;  Laterality: N/A;   KNEE ARTHROSCOPY WITH ANTERIOR CRUCIATE LIGAMENT (ACL) REPAIR WITH HAMSTRING GRAFT Left 09/11/2013   Procedure: LEFT KNEE ARTHROSCOPY WITH ALLOGRAFT ANTERIOR CRUCIATE LIGAMENT (ACL) RECONSTRUCTION PARTIAL MENISCECTOMY  CHONDROPLASTY ;  Surgeon: Eugenia Mcalpine, MD;  Location: [facility];  Service: Orthopedics;  Laterality: Left;   TONSILLECTOMY  1996   TUBAL LIGATION  2007    Current Medications: Current Meds  Medication Sig   estradiol (VIVELLE-DOT) 0.05 MG/24HR patch 1 patch 2 (two) times a week.   FLUoxetine (  PROZAC) 20 MG capsule Take 20 mg by mouth daily.   progesterone (PROMETRIUM) 100 MG capsule Take 100 mg by mouth at bedtime.   zolpidem (AMBIEN) 10 MG tablet Take 5 mg by mouth at bedtime as needed for sleep.    Allergies:   Patient has no known allergies.   Social History   Socioeconomic History   Marital status: Married    Spouse name: Not on file   Number of children: Not on file   Years of education: Not on file   Highest education level: Not on file  Occupational History   Not on file  Tobacco Use   Smoking status: Never   Smokeless tobacco: Never  Substance and Sexual Activity   Alcohol use: Yes    Comment: OCCASIONAL   Drug use: No   Sexual activity: Not on file  Other Topics  Concern   Not on file  Social History Narrative   Not on file   Social Determinants of Health   Financial Resource Strain: Not on file  Food Insecurity: Not on file  Transportation Needs: Not on file  Physical Activity: Not on file  Stress: Not on file  Social Connections: Not on file     Family History:  The patient's family history includes Cancer in her father; Heart attack (age of onset: 2) in her brother; Heart block in her father; Heart disease (age of onset: 88) in her father; Heart failure in her maternal grandfather, maternal grandmother, paternal grandfather, and paternal grandmother.   ROS:   Please see the history of present illness.    ROS All other systems reviewed and are negative.  No flowsheet data found.   PHYSICAL EXAM:   VS:  BP 112/76    Pulse 81    Ht 5\' 4"  (1.626 m)    Wt 143 lb 3.2 oz (65 kg)    SpO2 98%    BMI 24.58 kg/m    No data found.    GEN: Well nourished, well developed in no acute distress HEENT: Normal NECK: No JVD; No carotid bruits LYMPHATICS: No lymphadenopathy CARDIAC:RRR, no murmurs, rubs, gallops RESPIRATORY:  Clear to auscultation without rales, wheezing or rhonchi  ABDOMEN: Soft, non-tender, non-distended MUSCULOSKELETAL:  No edema; No deformity  SKIN: Warm and dry NEUROLOGIC:  Alert and oriented x 3 PSYCHIATRIC:  Normal affect   Wt Readings from Last 3 Encounters:  08/31/21 143 lb 3.2 oz (65 kg)  06/21/21 150 lb 6.4 oz (68.2 kg)  02/10/21 145 lb 12.8 oz (66.1 kg)      Studies/Labs Reviewed:   EKG:  EKG is not ordered today.    Recent Labs: 06/18/2021: Magnesium 1.9 08/04/2021: BUN 11; Creatinine, Ser 0.75; Hemoglobin 13.9; Platelets 312; Potassium 3.8; Sodium 139   Lipid Panel No results found for: CHOL, TRIG, HDL, CHOLHDL, VLDL, LDLCALC, LDLDIRECT  Additional studies/ records that were reviewed today include:  none    ASSESSMENT:    1. Syncope and collapse   2. Family history of early CAD      PLAN:   In order of problems listed above:  1.  Syncope -This occurred in November 2022 and suspect this was orthostatic in nature as she had had a busy few days getting ready for her daughter's Deb ball with the lot of family and friends in town and was not staying hydrated .  No seizure activity was witnessed  -coronary calcium score was 0 in 2022 -The echo showed normal LV function with normal  LV strain -30-day event monitor showed no arrhythmias -She has not had any further syncopal episodes -Encouraged her for now to avoid caffeine and alcohol and make sure she drinks at least 64 ounces of fluid daily  2.  Family history of premature CAD/Chest pain - she has a history of early CAD on both her maternal and paternal sides of the family. Her father had an MI at 27 and her brother had an MI at 55.    -She was exposed to second had smoke as a child.   -stress echo in 2019 showed no ischemia and normal LVF -coronary Ca score was 0 -Coronary CTA done 06/28/2021 demonstrated no CAD and coronary calcium score 0 with normal coronary origin.  Medication Adjustments/Labs and Tests Ordered: Current medicines are reviewed at length with the patient today.  Concerns regarding medicines are outlined above.  Medication changes, Labs and Tests ordered today are listed in the Patient Instructions below.  There are no Patient Instructions on file for this visit.   Signed, Armanda Magic, MD  08/31/2021 10:01 AM    [facility] [address] [place], [place], [place]  [address] Phone: [phone]; Fax: [phone]

## 2021-08-31 NOTE — Patient Instructions (Signed)
Medication Instructions:  Your physician recommends that you continue on your current medications as directed. Please refer to the Current Medication list given to you today.  *If you need a refill on your cardiac medications before your next appointment, please call your pharmacy*  Follow-Up: At [facility], you and your health needs are our priority.  As part of our continuing mission to provide you with exceptional heart care, we have created designated Provider Care Teams.  These Care Teams include your primary Cardiologist (physician) and Advanced Practice Providers (APPs -  Physician Assistants and Nurse Practitioners) who all work together to provide you with the care you need, when you need it.   Follow-up as needed with Dr. Mayford Knife.

## 2021-09-21 DIAGNOSIS — R102 Pelvic and perineal pain: Secondary | ICD-10-CM | POA: Diagnosis not present

## 2021-09-21 DIAGNOSIS — N950 Postmenopausal bleeding: Secondary | ICD-10-CM | POA: Diagnosis not present

## 2021-09-25 DIAGNOSIS — R102 Pelvic and perineal pain: Secondary | ICD-10-CM | POA: Diagnosis not present

## 2021-09-25 DIAGNOSIS — N950 Postmenopausal bleeding: Secondary | ICD-10-CM | POA: Diagnosis not present

## 2021-09-26 DIAGNOSIS — E041 Nontoxic single thyroid nodule: Secondary | ICD-10-CM | POA: Diagnosis not present

## 2021-09-27 DIAGNOSIS — Z1339 Encounter for screening examination for other mental health and behavioral disorders: Secondary | ICD-10-CM | POA: Diagnosis not present

## 2021-09-27 DIAGNOSIS — Z1331 Encounter for screening for depression: Secondary | ICD-10-CM | POA: Diagnosis not present

## 2021-09-27 DIAGNOSIS — Z0000 Encounter for general adult medical examination without abnormal findings: Secondary | ICD-10-CM | POA: Diagnosis not present

## 2021-10-04 DIAGNOSIS — E041 Nontoxic single thyroid nodule: Secondary | ICD-10-CM | POA: Diagnosis not present

## 2021-10-10 DIAGNOSIS — N950 Postmenopausal bleeding: Secondary | ICD-10-CM | POA: Diagnosis not present

## 2021-10-31 DIAGNOSIS — L570 Actinic keratosis: Secondary | ICD-10-CM | POA: Diagnosis not present

## 2021-10-31 DIAGNOSIS — L918 Other hypertrophic disorders of the skin: Secondary | ICD-10-CM | POA: Diagnosis not present

## 2021-10-31 DIAGNOSIS — L578 Other skin changes due to chronic exposure to nonionizing radiation: Secondary | ICD-10-CM | POA: Diagnosis not present

## 2021-10-31 DIAGNOSIS — D225 Melanocytic nevi of trunk: Secondary | ICD-10-CM | POA: Diagnosis not present

## 2021-10-31 DIAGNOSIS — D2272 Melanocytic nevi of left lower limb, including hip: Secondary | ICD-10-CM | POA: Diagnosis not present

## 2021-11-06 DIAGNOSIS — Z6824 Body mass index (BMI) 24.0-24.9, adult: Secondary | ICD-10-CM | POA: Diagnosis not present

## 2021-11-06 DIAGNOSIS — Z1231 Encounter for screening mammogram for malignant neoplasm of breast: Secondary | ICD-10-CM | POA: Diagnosis not present

## 2021-11-06 DIAGNOSIS — Z01419 Encounter for gynecological examination (general) (routine) without abnormal findings: Secondary | ICD-10-CM | POA: Diagnosis not present

## 2021-11-14 DIAGNOSIS — Z86010 Personal history of colonic polyps: Secondary | ICD-10-CM | POA: Diagnosis not present

## 2021-11-14 DIAGNOSIS — D122 Benign neoplasm of ascending colon: Secondary | ICD-10-CM | POA: Diagnosis not present

## 2021-11-17 DIAGNOSIS — E785 Hyperlipidemia, unspecified: Secondary | ICD-10-CM | POA: Diagnosis not present

## 2021-11-17 DIAGNOSIS — N950 Postmenopausal bleeding: Secondary | ICD-10-CM | POA: Diagnosis not present

## 2021-11-17 DIAGNOSIS — Z13228 Encounter for screening for other metabolic disorders: Secondary | ICD-10-CM | POA: Diagnosis not present

## 2021-11-17 DIAGNOSIS — Z1329 Encounter for screening for other suspected endocrine disorder: Secondary | ICD-10-CM | POA: Diagnosis not present

## 2021-11-17 DIAGNOSIS — Z131 Encounter for screening for diabetes mellitus: Secondary | ICD-10-CM | POA: Diagnosis not present

## 2021-11-17 DIAGNOSIS — Z130 Encounter for screening for diseases of the blood and blood-forming organs and certain disorders involving the immune mechanism: Secondary | ICD-10-CM | POA: Diagnosis not present

## 2022-02-26 IMAGING — CT CT MAXILLOFACIAL W/ CM
3 series · 16 of 47 positions shown, 19 images · IV contrast (agent unspecified)
Comparison: 06/18/2021

CLINICAL DATA: Right nostril pain and swelling with redness, on
doxycycline.

EXAM:
CT MAXILLOFACIAL WITH CONTRAST
TECHNIQUE: Multidetector CT imaging of the maxillofacial structures was
performed with intravenous contrast. Multiplanar CT image
reconstructions were also generated.

[Series 3: facialbone 2.0 st · axial · 0.31mm/px · z∈[-141,-1]mm · 10 of 82 slices shown, 13 images]
[im 6/82  brain]
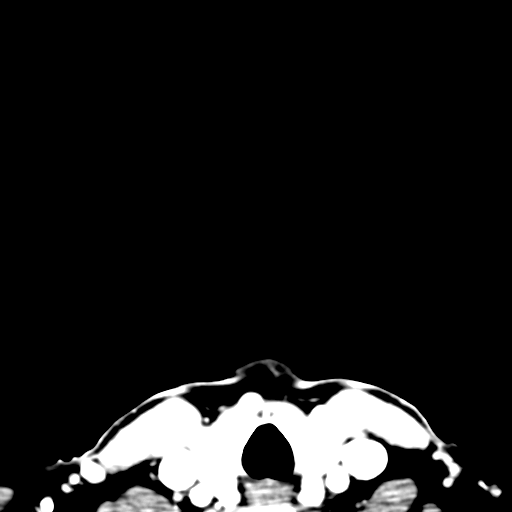
[im 6/82  bone]
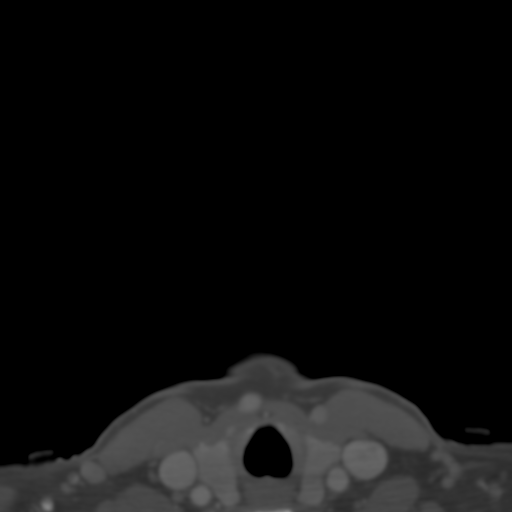
[im 14/82  bone]
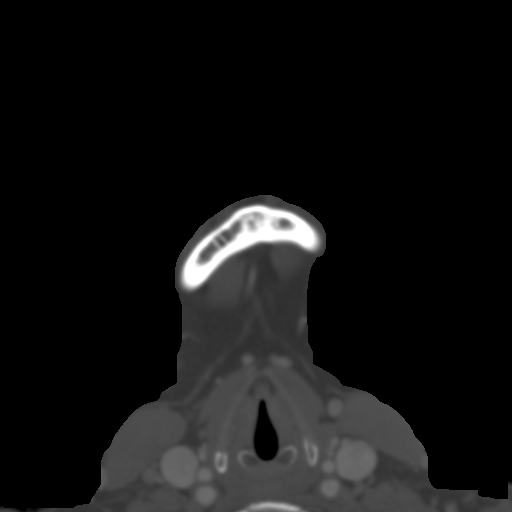
[im 23/82  bone]
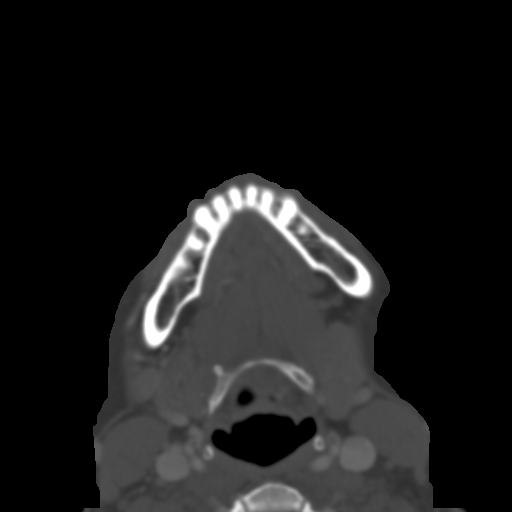
[im 28/82  bone]
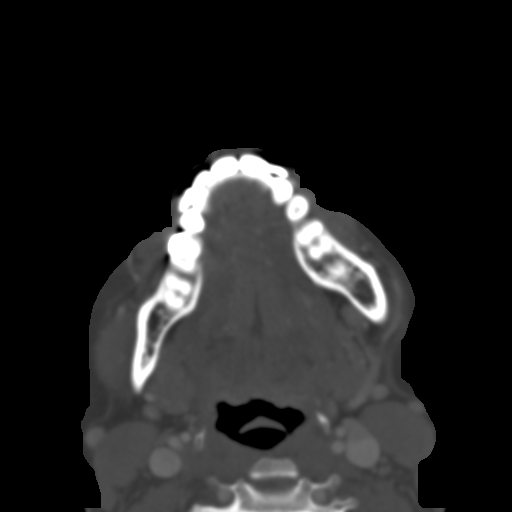
[im 37/82  brain]
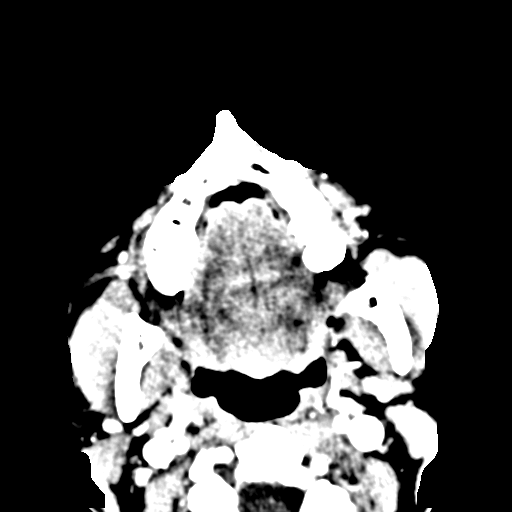
[im 37/82  bone]
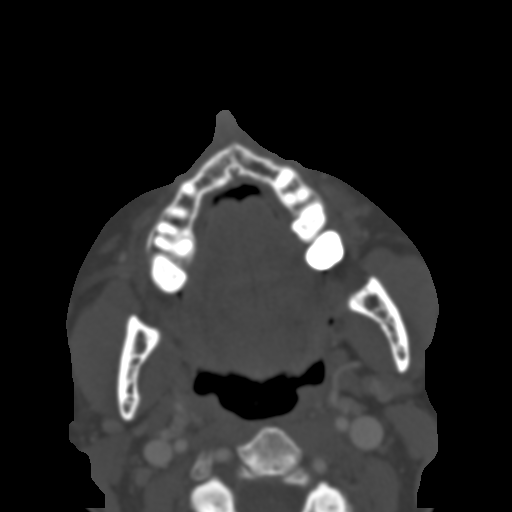
[im 45/82  bone]
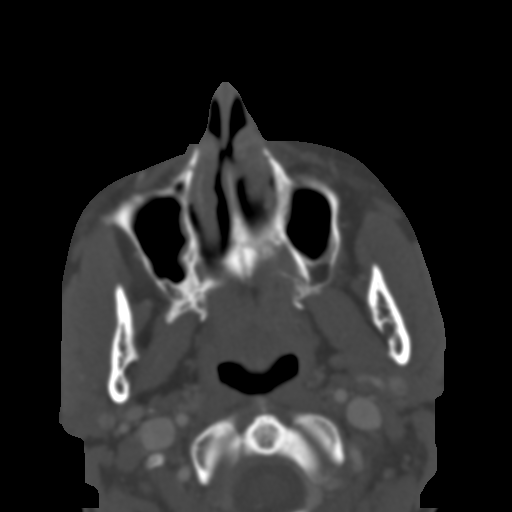
[im 54/82  bone]
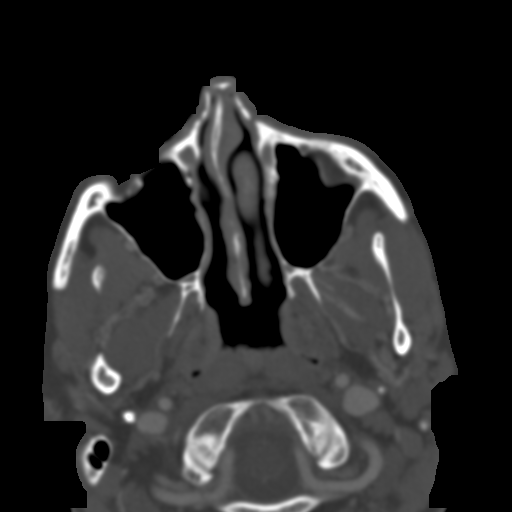
[im 62/82  bone]
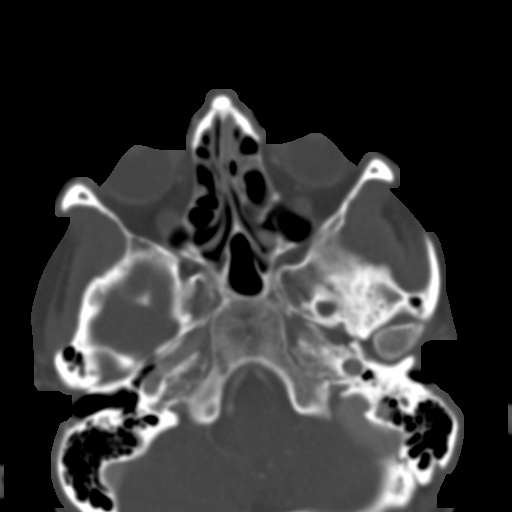
[im 68/82  brain]
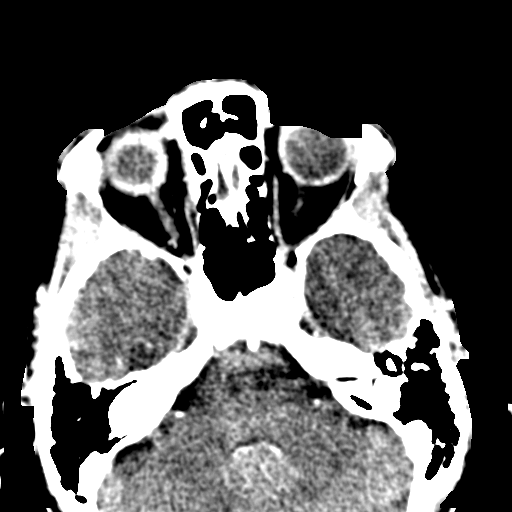
[im 68/82  bone]
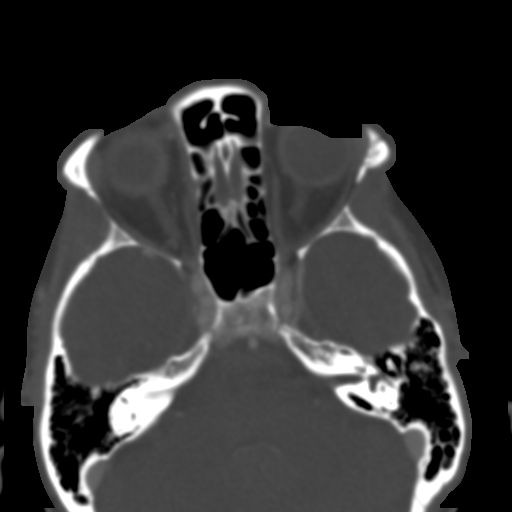
[im 76/82  bone]
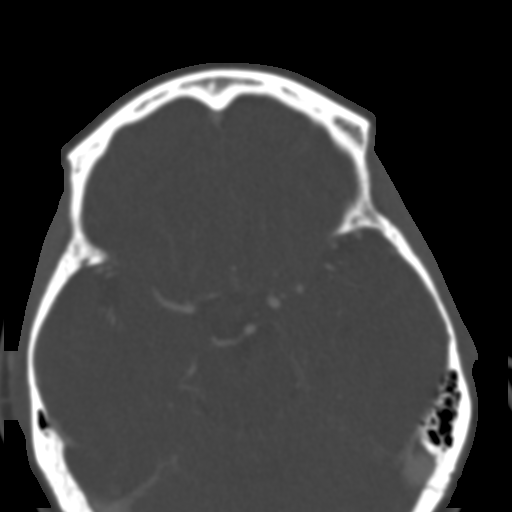

[Series 9: facialbone 2.0 cor st · coronal · 0.35mm/px · 3 of 81 slices shown]
[im 27/81  bone]
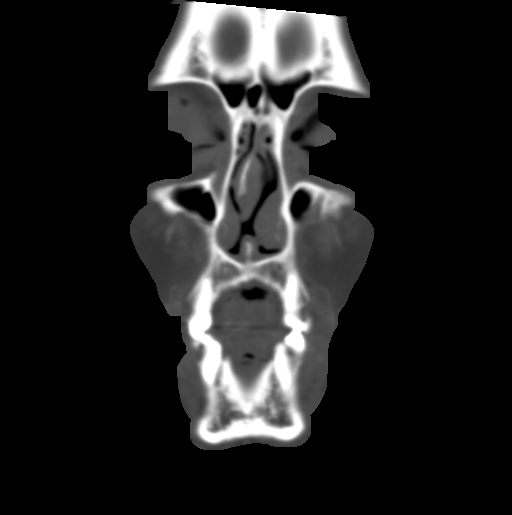
[im 36/81  bone]
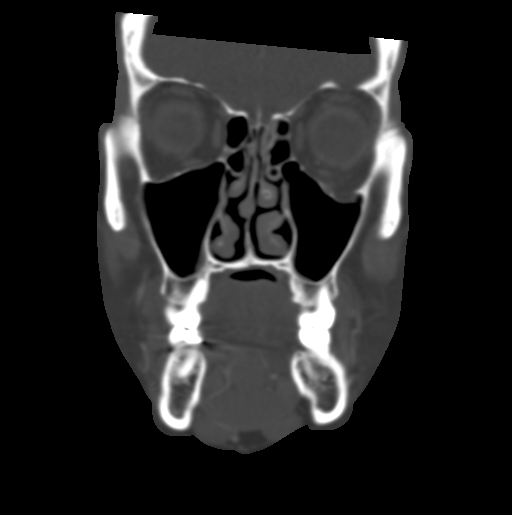
[im 45/81  bone]
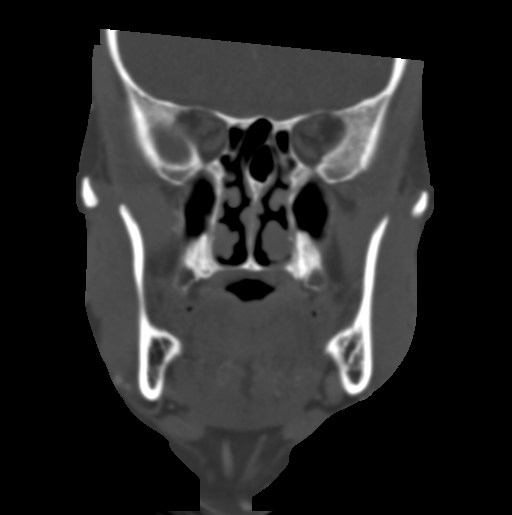

[Series 10: facialbone 2.0 sag st · sagittal · 0.33mm/px · 3 of 87 slices shown]
[im 29/87  bone]
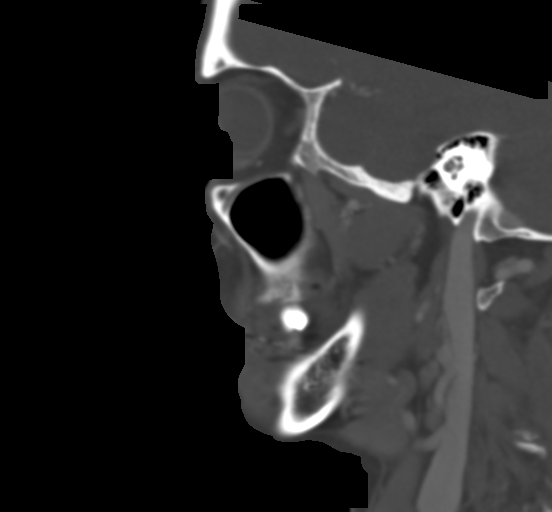
[im 44/87  bone]
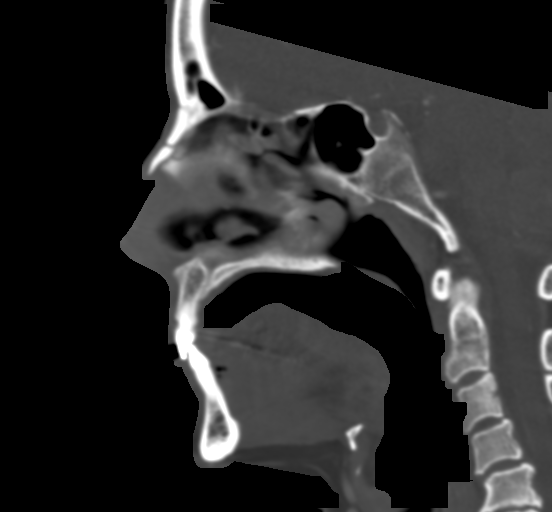
[im 58/87  bone]
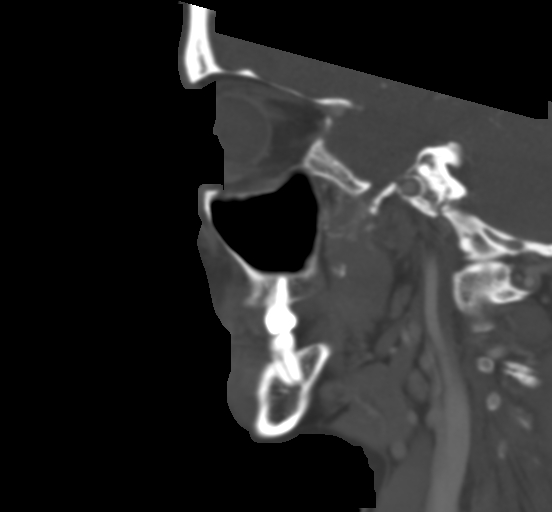

[16 of 47 positions shown; findings below may reference images not displayed]

RADIATION DOSE REDUCTION: This exam was performed according to the
departmental dose-optimization program which includes automated
exposure control, adjustment of the mA and/or kV according to
patient size and/or use of iterative reconstruction technique.

CONTRAST:  80mL OMNIPAQUE IOHEXOL 350 MG/ML SOLN
FINDINGS: Osseous: No acute fracture or erosion. Remote bilateral nasal arch
and left orbital floor fractures.

Orbits: No evidence of inflammation or mass

Sinuses: Minor mucosal thickening on the floor of the maxillary
sinus bilaterally. Chronic inferior nasal septal perforation.

Soft tissues: 6 mm fluid collection with regional soft tissue
hyperenhancement and thickening involving the nasal columella. No
deep source identified such as from the teeth.

Limited intracranial: Negative
IMPRESSION: 6mm abscess in the nasal columella with regional cellulitis.

## 2022-09-09 DIAGNOSIS — S50812A Abrasion of left forearm, initial encounter: Secondary | ICD-10-CM | POA: Diagnosis not present

## 2022-09-09 DIAGNOSIS — M25532 Pain in left wrist: Secondary | ICD-10-CM | POA: Diagnosis not present

## 2022-09-11 ENCOUNTER — Encounter: Payer: Self-pay | Admitting: Physician Assistant

## 2022-09-11 ENCOUNTER — Ambulatory Visit: Payer: BC Managed Care – PPO | Attending: Cardiology | Admitting: Physician Assistant

## 2022-09-11 VITALS — BP 110/70 | HR 57 | Ht 64.0 in | Wt 132.4 lb

## 2022-09-11 DIAGNOSIS — Z8249 Family history of ischemic heart disease and other diseases of the circulatory system: Secondary | ICD-10-CM

## 2022-09-11 DIAGNOSIS — R55 Syncope and collapse: Secondary | ICD-10-CM

## 2022-09-11 NOTE — Progress Notes (Addendum)
Office Visit    [name] Name: Kathy [name] Date of Encounter: 09/11/2022  PCP:  Michael Boston, MD   [place]  Cardiologist:  Fransico Him, MD  Advanced Practice Provider:  No care team member to display Electrophysiologist:  None   HPI    Kathy [name] is a 57 y.o. female with past medical history significant for strong family history of CAD and no cardiac history presents today for annual follow-up appointment.  Her father had an MI in his 68s and her brother had an MI at 78 years old.  She was initially seen 4 years ago for cardiac workup.  Coronary calcium score of 0 and stress test showed no ischemia.  She was last seen February 2023 and that time she had had an episode of syncope a few months prior.  Apparently she was getting out of bed 9 AM and stood up and walked to the edge of the bed and then fell forward striking her face.  Her husband witnessed the fall and said that her eyes were open but she was nonresponsive. [name]'s.  Following that she was awake seemed confused for at least a minute or so there was no witnessed seizure activity and she has no history of seizure or syncope in the past.  Had  had alcohol the night prior and did not get up in the early hours of the morning.  Workup in the ER showed normal troponin at 5 and 4, serum creatinine 1.61, potassium 3.8, magnesium 1.9 and normal CBC.  CT was negative and maxillofacial CT showed a nondisplaced depressed left nasal fracture superimposed on chronic nasal fractures as well as left orbital floor fracture depressed 5 mm.  She was referred for cardiac evaluation.  It was felt was likely due to orthostatic hypotension from not staying hydrated during the day and having alcohol the night before.  Event monitor was worn which showed no arrhythmias.  2D echocardiogram showed normal LVEF 50 to 55%.  Left atrium mildly dilated  When she was last seen by Dr. Radford Pax she was doing well without any  chest pain or pressure.  No SOB, DOE, PND, orthopnea.  No lower extremity edema, dizziness, palpitations, or syncope.  Compliant with medications.  Today, she tells me that she has had 2 episodes of fainting since last year.  In January she was on a trip in [place] and got up too fast and passed out.  Did not injure herself.  She figures she was dehydrated at that time.  Then, she was in [place] and got up to turn her alarm off in the morning and again fainted.  Came to within a few seconds.  No other symptoms were associated with these events.  Denies palpitations, chest pain, or shortness of breath.  She stays active without any symptoms.  She is sleeping okay at night.  Not on any cardiac medications.  She is not taking Ambien anymore and is not taking magnesium to help with sleep.  No dizziness or lightheadedness.  No prodrome prior to fainting episodes.  Full workup already done a year ago with echocardiogram, Zio monitor, and CT coronary of the chest.  Reports no shortness of breath nor dyspnea on exertion. Reports no chest pain, pressure, or tightness. No edema, orthopnea, PND. Reports no palpitations.    EKG:  EKG is ordered today.  NSR rate 57 bpm.  Recent Labs: No results found for requested labs within last 365 days.  Recent Lipid Panel No results found for: "CHOL", "TRIG", "HDL", "CHOLHDL", "VLDL", "LDLCALC", "LDLDIRECT"   Home Medications   Current Meds  Medication Sig   estradiol (VIVELLE-DOT) 0.05 MG/24HR patch 1 patch 2 (two) times a week.   FLUoxetine (PROZAC) 20 MG capsule Take 20 mg by mouth daily.   progesterone (PROMETRIUM) 100 MG capsule Take 100 mg by mouth at bedtime.   zolpidem (AMBIEN) 10 MG tablet Take 5 mg by mouth at bedtime as needed for sleep.     Review of Systems      All other systems reviewed and are otherwise negative except as noted above.  Physical Exam    VS:  BP 110/70   Pulse (!) 57   Ht 5' 4"$  (1.626 m)   Wt 132 lb 6.4 oz (60.1 kg)   SpO2 98%    BMI 22.73 kg/m  , BMI Body mass index is 22.73 kg/m.  Wt Readings from Last 3 Encounters:  09/11/22 132 lb 6.4 oz (60.1 kg)  08/31/21 143 lb 3.2 oz (65 kg)  06/21/21 150 lb 6.4 oz (68.2 kg)     GEN: Well nourished, well developed, in no acute distress. HEENT: normal. Neck: Supple, no JVD, carotid bruits, or masses. Cardiac: RRR, no murmurs, rubs, or gallops. No clubbing, cyanosis, edema.  Radials/PT 2+ and equal bilaterally.  Respiratory:  Respirations regular and unlabored, clear to auscultation bilaterally. GI: Soft, nontender, nondistended. MS: No deformity or atrophy. Skin: Warm and dry, no rash. Neuro:  Strength and sensation are intact. Psych: Normal affect.  Assessment & Plan    Syncope -carotid US -labs reviewed and electrolytes normal -Reviewed echocardiogram, coronary CTA and ZIO monitor from last year -encouraged hydration and slow transitions -might look into neurogenetic cause however, cardiac workup as been negative and seems like orthostatics are the culprit.          Disposition: Follow up 1 year with Fransico Him, MD or APP.  Signed, Elgie Collard, PA-C 09/11/2022, 4:32 PM [facility]

## 2022-09-11 NOTE — Patient Instructions (Signed)
Medication Instructions:  Your physician recommends that you continue on your current medications as directed. Please refer to the Current Medication list given to you today.  *If you need a refill on your cardiac medications before your next appointment, please call your pharmacy*   Lab Work: None ordered If you have labs (blood work) drawn today and your tests are completely normal, you will receive your results only by: [facility] (if you have MyChart) OR A paper copy in the mail If you have any lab test that is abnormal or we need to change your treatment, we will call you to review the results.   Testing/Procedures: Your physician has requested that you have a carotid duplex. This test is an ultrasound of the carotid arteries in your neck. It looks at blood flow through these arteries that supply the brain with blood. Allow one hour for this exam. There are no restrictions or special instructions.    Follow-Up: At [facility], you and your health needs are our priority.  As part of our continuing mission to provide you with exceptional heart care, we have created designated Provider Care Teams.  These Care Teams include your primary Cardiologist (physician) and Advanced Practice Providers (APPs -  Physician Assistants and Nurse Practitioners) who all work together to provide you with the care you need, when you need it.  Your next appointment:   1 year(s)  Provider:   Fransico Him, MD

## 2022-09-19 ENCOUNTER — Ambulatory Visit (HOSPITAL_COMMUNITY)
Admission: RE | Admit: 2022-09-19 | Discharge: 2022-09-19 | Disposition: A | Discharge status: Home / Self Care | Payer: BC Managed Care – PPO | Source: Ambulatory Visit | Attending: Physician Assistant | Admitting: Physician Assistant

## 2022-09-19 ENCOUNTER — Ambulatory Visit: Payer: BC Managed Care – PPO | Admitting: Cardiology

## 2022-09-19 DIAGNOSIS — R55 Syncope and collapse: Secondary | ICD-10-CM | POA: Diagnosis not present

## 2022-11-15 DIAGNOSIS — Z1231 Encounter for screening mammogram for malignant neoplasm of breast: Secondary | ICD-10-CM | POA: Diagnosis not present

## 2022-11-15 DIAGNOSIS — Z01419 Encounter for gynecological examination (general) (routine) without abnormal findings: Secondary | ICD-10-CM | POA: Diagnosis not present

## 2022-11-15 DIAGNOSIS — Z6821 Body mass index (BMI) 21.0-21.9, adult: Secondary | ICD-10-CM | POA: Diagnosis not present

## 2022-12-13 DIAGNOSIS — R898 Other abnormal findings in specimens from other organs, systems and tissues: Secondary | ICD-10-CM | POA: Diagnosis not present

## 2022-12-13 DIAGNOSIS — J029 Acute pharyngitis, unspecified: Secondary | ICD-10-CM | POA: Diagnosis not present

## 2022-12-13 DIAGNOSIS — R0981 Nasal congestion: Secondary | ICD-10-CM | POA: Diagnosis not present

## 2022-12-13 DIAGNOSIS — E559 Vitamin D deficiency, unspecified: Secondary | ICD-10-CM | POA: Diagnosis not present

## 2022-12-13 DIAGNOSIS — H6692 Otitis media, unspecified, left ear: Secondary | ICD-10-CM | POA: Diagnosis not present

## 2023-04-11 DIAGNOSIS — D7589 Other specified diseases of blood and blood-forming organs: Secondary | ICD-10-CM | POA: Diagnosis not present

## 2023-04-11 DIAGNOSIS — E041 Nontoxic single thyroid nodule: Secondary | ICD-10-CM | POA: Diagnosis not present

## 2023-04-11 DIAGNOSIS — R7989 Other specified abnormal findings of blood chemistry: Secondary | ICD-10-CM | POA: Diagnosis not present

## 2023-04-18 DIAGNOSIS — Z23 Encounter for immunization: Secondary | ICD-10-CM | POA: Diagnosis not present

## 2023-04-18 DIAGNOSIS — Z0000 Encounter for general adult medical examination without abnormal findings: Secondary | ICD-10-CM | POA: Diagnosis not present

## 2023-04-18 DIAGNOSIS — Z1339 Encounter for screening examination for other mental health and behavioral disorders: Secondary | ICD-10-CM | POA: Diagnosis not present

## 2023-04-18 DIAGNOSIS — Z1331 Encounter for screening for depression: Secondary | ICD-10-CM | POA: Diagnosis not present

## 2023-05-29 DIAGNOSIS — D225 Melanocytic nevi of trunk: Secondary | ICD-10-CM | POA: Diagnosis not present

## 2023-05-29 DIAGNOSIS — Z85828 Personal history of other malignant neoplasm of skin: Secondary | ICD-10-CM | POA: Diagnosis not present

## 2023-05-29 DIAGNOSIS — L738 Other specified follicular disorders: Secondary | ICD-10-CM | POA: Diagnosis not present

## 2023-05-29 DIAGNOSIS — L821 Other seborrheic keratosis: Secondary | ICD-10-CM | POA: Diagnosis not present

## 2023-05-29 DIAGNOSIS — L578 Other skin changes due to chronic exposure to nonionizing radiation: Secondary | ICD-10-CM | POA: Diagnosis not present

## 2023-05-29 DIAGNOSIS — D485 Neoplasm of uncertain behavior of skin: Secondary | ICD-10-CM | POA: Diagnosis not present

## 2023-05-29 DIAGNOSIS — L570 Actinic keratosis: Secondary | ICD-10-CM | POA: Diagnosis not present

## 2023-06-25 DIAGNOSIS — J069 Acute upper respiratory infection, unspecified: Secondary | ICD-10-CM | POA: Diagnosis not present

## 2023-06-25 DIAGNOSIS — J029 Acute pharyngitis, unspecified: Secondary | ICD-10-CM | POA: Diagnosis not present

## 2023-06-25 DIAGNOSIS — R0981 Nasal congestion: Secondary | ICD-10-CM | POA: Diagnosis not present

## 2023-09-02 DIAGNOSIS — H524 Presbyopia: Secondary | ICD-10-CM | POA: Diagnosis not present

## 2023-09-02 DIAGNOSIS — H5203 Hypermetropia, bilateral: Secondary | ICD-10-CM | POA: Diagnosis not present

## 2023-09-02 DIAGNOSIS — D3141 Benign neoplasm of right ciliary body: Secondary | ICD-10-CM | POA: Diagnosis not present

## 2023-09-02 DIAGNOSIS — H11153 Pinguecula, bilateral: Secondary | ICD-10-CM | POA: Diagnosis not present

## 2023-09-12 ENCOUNTER — Encounter: Payer: Self-pay | Admitting: Cardiology

## 2023-09-12 ENCOUNTER — Ambulatory Visit: Payer: BC Managed Care – PPO | Attending: Cardiology | Admitting: Cardiology

## 2023-09-12 VITALS — BP 114/68 | HR 60 | Ht 65.0 in | Wt 124.6 lb

## 2023-09-12 DIAGNOSIS — E785 Hyperlipidemia, unspecified: Secondary | ICD-10-CM

## 2023-09-12 DIAGNOSIS — R55 Syncope and collapse: Secondary | ICD-10-CM | POA: Diagnosis not present

## 2023-09-12 DIAGNOSIS — Z8249 Family history of ischemic heart disease and other diseases of the circulatory system: Secondary | ICD-10-CM | POA: Diagnosis not present

## 2023-09-12 NOTE — Patient Instructions (Addendum)
 Medication Instructions:  Your physician recommends that you continue on your current medications as directed. Please refer to the Current Medication list given to you today.  *If you need a refill on your cardiac medications before your next appointment, please call your pharmacy*  Lab Work: At any [facility] location: fasting NMR, lipoprotein A You may also go to any of these [facility] locations:   [facility] - [address] ([facility]) - [address] [phone] [address]   [place] - [address]    [place]  - [address]    [place] - [address] - [address] [employer]  - [address] - [address] ([facility]  If you have labs (blood work) drawn today and your tests are completely normal, you will receive your results only by: [employer] (if you have MyChart) OR A paper copy in the mail If you have any lab test that is abnormal or we need to change your treatment, we will call you to review the results.  Follow-Up: At [facility], you and your health needs are our priority.  As part of our continuing mission to provide you with exceptional heart care, we have created designated Provider Care Teams.  These Care Teams include your primary Cardiologist (physician) and Advanced Practice Providers (APPs -  Physician Assistants and Nurse Practitioners) who all work together to provide you with the care you need, when you need it.  Your next appointment:   1 year(s)  The format for your next appointment:   In Person  Provider:   Armanda Magic, MD {  Other Instructions   1st Floor: - Lobby - Registration  - Pharmacy  - Lab - Cafe  2nd Floor: - PV Lab - Diagnostic Testing (echo, CT, nuclear med)  3rd Floor: - Vacant  4th Floor: - TCTS (cardiothoracic surgery) - AFib Clinic - Structural Heart Clinic - Vascular Surgery   - Vascular Ultrasound  5th Floor: - HeartCare Cardiology (general and EP) - Clinical Pharmacy for coumadin, hypertension, lipid, weight-loss medications, and med management appointments    Valet parking services will be available as well.

## 2023-09-12 NOTE — Progress Notes (Addendum)
 Office Visit    Patient Name: Kathy [name] Date of Encounter: 09/12/2023  PCP:  Melida Quitter, MD   [facility]  Cardiologist:  Armanda Magic, MD  Advanced Practice Provider:  No care team member to display Electrophysiologist:  None   HPI    Kathy [name] is a 58 y.o. female with past medical history significant for strong family history of CAD and no cardiac history presents today for annual follow-up appointment.  Her father had an MI in his 78s and her brother had an MI at 80 years old.  She was initially seen 4 years ago for cardiac workup.  Coronary calcium score of 0 and stress test showed no ischemia.  She was lseen February 2023 and that time she had had an episode of syncope a few months prior.  Apparently she was getting out of bed 9 AM and stood up and walked to the edge of the bed and then fell forward striking her face.  Her husband witnessed the fall and said that her eyes were open but she was nonresponsive. Following that she was awake seemed confused for at least a minute or so there was no witnessed seizure activity and she has no history of seizure or syncope in the past.  Had  had alcohol the night prior and did not get up in the early hours of the morning.  Workup in the ER showed normal troponin at 5 and 4, serum creatinine 1.61, potassium 3.8, magnesium 1.9 and normal CBC.  CT was negative and maxillofacial CT showed a nondisplaced depressed left nasal fracture superimposed on chronic nasal fractures as well as left orbital floor fracture depressed 5 mm.  She was referred for cardiac evaluation.  It was felt was likely due to orthostatic hypotension from not staying hydrated during the day and having alcohol the night before.  Event monitor was worn which showed no arrhythmias.  2D echocardiogram showed normal LVEF 50 to 55%.  Left atrium mildly dilated  Seen by Jari Favre, PA 08/2022 and had had 2 episodes of fainting that year.  In  January she was on a trip in [place] and got up too fast and passed out.  Did not injure herself.  She figures she was dehydrated at that time.  Then, she was in [place] and got up to turn her alarm off in the morning and again fainted.  Came to within a few seconds.  No other symptoms were associated with these events.    She is here today for followup and is doing well.  She denies any chest pain or pressure, SOB, DOE, PND, orthopnea, LE edema, dizziness, palpitations or syncope. She is compliant with her meds and is tolerating meds with no SE.    EKG Interpretation Date/Time:  Thursday September 12 2023 14:22:58 EST Ventricular Rate:  60 PR Interval:  136 QRS Duration:  80 QT Interval:  422 QTC Calculation: 422 R Axis:   73  Text Interpretation: Normal sinus rhythm Septal infarct (cited on or before 18-Jun-2021) When compared with ECG of 18-Jun-2021 10:46, Nonspecific T wave abnormality no longer evident in Inferior leads Confirmed by Armanda Magic ([id number]) on 09/12/2023 2:27:15 PM    Recent Labs: No results found for requested labs within last 365 days.  Recent Lipid Panel No results found for: "CHOL", "TRIG", "HDL", "CHOLHDL", "VLDL", "LDLCALC", "LDLDIRECT"   Home Medications   Current Meds  Medication Sig   estradiol (VIVELLE-DOT) 0.05 MG/24HR patch  1 patch 2 (two) times a week.   FLUoxetine (PROZAC) 20 MG capsule Take 20 mg by mouth daily.   progesterone (PROMETRIUM) 100 MG capsule Take 100 mg by mouth at bedtime.   zolpidem (AMBIEN) 10 MG tablet Take 5 mg by mouth at bedtime as needed for sleep.     Review of Systems      All other systems reviewed and are otherwise negative except as noted above.  Physical Exam    VS:  BP 114/68   Pulse 60   Ht 5\' 5"  (1.651 m)   Wt 124 lb 9.6 oz (56.5 kg)   SpO2 97%   BMI 20.73 kg/m  , BMI Body mass index is 20.73 kg/m.  Wt Readings from Last 3 Encounters:  09/12/23 124 lb 9.6 oz (56.5 kg)  09/11/22 132 lb 6.4 oz (60.1 kg)   08/31/21 143 lb 3.2 oz (65 kg)    GEN: Well nourished, well developed in no acute distress HEENT: Normal NECK: No JVD; No carotid bruits LYMPHATICS: No lymphadenopathy CARDIAC:RRR, no murmurs, rubs, gallops RESPIRATORY:  Clear to auscultation without rales, wheezing or rhonchi  ABDOMEN: Soft, non-tender, non-distended MUSCULOSKELETAL:  No edema; No deformity  SKIN: Warm and dry NEUROLOGIC:  Alert and oriented x 3 PSYCHIATRIC:  Normal affect  Assessment & Plan    Syncope -carotid dopplers 08/2022 showed minimal intimal thickening and no plaque -2D echo 06/2021 with low normal LVF EF 50-55% with normal LV strain, no valvular disease -coronary CTA 06/2021 with cor Cal score 0 and no CAD -felt to be orthostatic in nature -had 2 more episodes of syncope in 2024 : once when in [place] and got up too fast and passed out.  Did not injure herself.  She figures she was dehydrated at that time.  Then shhe was in [place] and got up to turn her alarm off in the morning and again fainted. -no further syncope or dizziness since then  2.  Family hx of premature CAD -2D echo 06/2021 with low normal LVF EF 50-55% with normal LV strain, no valvular disease -coronary CTA 06/2021 with cor Cal score 0 and no CAD -denies any anginal sx  3.  HLD -LDL goal <100 -her last LDL was 147 in (03/2023) -I will repeat an NMR lipomed panel with Lp(a) to assess further cardiac risk   Disposition: Follow up 1 year  Signed, Armanda Magic, MD 09/12/2023, 2:31 PM [facility]

## 2023-09-19 DIAGNOSIS — Z7184 Encounter for health counseling related to travel: Secondary | ICD-10-CM | POA: Diagnosis not present

## 2023-09-19 DIAGNOSIS — E785 Hyperlipidemia, unspecified: Secondary | ICD-10-CM | POA: Diagnosis not present

## 2023-09-21 LAB — NMR, LIPOPROFILE
Cholesterol, Total: 215 mg/dL — ABNORMAL HIGH (ref 100–199)
HDL Particle Number: 37.3 umol/L (ref >=30.5)
HDL-C: 80 mg/dL (ref >39)
LDL Particle Number: 1080 nmol/L — ABNORMAL HIGH (ref <1000)
LDL Size: 21.8 nm (ref >20.5)
LDL-C (NIH Calc): 123 mg/dL — ABNORMAL HIGH (ref 0–99)
LP-IR Score: <25 (ref <=45)
Small LDL Particle Number: <90 nmol/L (ref <=527)
Triglycerides: 71 mg/dL (ref 0–149)

## 2023-09-21 LAB — LIPOPROTEIN A (LPA): Lipoprotein (a): <8.4 nmol/L (ref <75.0)

## 2023-09-23 ENCOUNTER — Telehealth: Payer: Self-pay

## 2023-09-23 DIAGNOSIS — E785 Hyperlipidemia, unspecified: Secondary | ICD-10-CM

## 2023-09-23 NOTE — Telephone Encounter (Signed)
-----   Message from Armanda Magic sent at 09/22/2023  6:51 PM EST ----- LDL is 123 and goal is <100.  Given her family hx I would recommend that she start low dose Crestor 2.5mg  daily and get an FLP and ALT in 6 weeks

## 2023-09-23 NOTE — Telephone Encounter (Signed)
 Left voicemail to return call to office

## 2023-09-24 MED ORDER — ROSUVASTATIN CALCIUM 5 MG PO TABS: 2.5000 mg | ORAL_TABLET | Freq: Every day | ORAL | 3 refills | Status: DC

## 2023-09-24 NOTE — Telephone Encounter (Signed)
 Left message to call back

## 2023-09-24 NOTE — Telephone Encounter (Signed)
Follow Up:    Patient  is retuning a call from yesterday. 

## 2023-09-24 NOTE — Telephone Encounter (Signed)
 Order for Crestor 2.5 mg once daily sent to pt preferred pharmacy. Pt stated on the phone that she is leaving the country in about 1 hr and won't be back for about 4 weeks. Fasting lipids and LFTs ordered to be completed in roughly 10 weeks to allow for pt to get back in town and be on the medication for 6 weeks.

## 2023-09-24 NOTE — Telephone Encounter (Signed)
 Pt returning call

## 2023-11-01 DIAGNOSIS — L570 Actinic keratosis: Secondary | ICD-10-CM | POA: Diagnosis not present

## 2023-11-26 DIAGNOSIS — M542 Cervicalgia: Secondary | ICD-10-CM | POA: Diagnosis not present

## 2023-12-02 ENCOUNTER — Encounter: Payer: Self-pay | Admitting: Cardiology

## 2023-12-03 ENCOUNTER — Other Ambulatory Visit: Payer: Self-pay

## 2023-12-03 DIAGNOSIS — Z124 Encounter for screening for malignant neoplasm of cervix: Secondary | ICD-10-CM | POA: Diagnosis not present

## 2023-12-03 DIAGNOSIS — Z8249 Family history of ischemic heart disease and other diseases of the circulatory system: Secondary | ICD-10-CM

## 2023-12-03 DIAGNOSIS — Z13220 Encounter for screening for lipoid disorders: Secondary | ICD-10-CM | POA: Diagnosis not present

## 2023-12-03 DIAGNOSIS — Z1329 Encounter for screening for other suspected endocrine disorder: Secondary | ICD-10-CM | POA: Diagnosis not present

## 2023-12-03 DIAGNOSIS — Z13228 Encounter for screening for other metabolic disorders: Secondary | ICD-10-CM | POA: Diagnosis not present

## 2023-12-03 DIAGNOSIS — E785 Hyperlipidemia, unspecified: Secondary | ICD-10-CM | POA: Diagnosis not present

## 2023-12-03 DIAGNOSIS — Z6820 Body mass index (BMI) 20.0-20.9, adult: Secondary | ICD-10-CM | POA: Diagnosis not present

## 2023-12-03 DIAGNOSIS — Z1231 Encounter for screening mammogram for malignant neoplasm of breast: Secondary | ICD-10-CM | POA: Diagnosis not present

## 2023-12-03 DIAGNOSIS — Z131 Encounter for screening for diabetes mellitus: Secondary | ICD-10-CM | POA: Diagnosis not present

## 2023-12-03 DIAGNOSIS — Z1151 Encounter for screening for human papillomavirus (HPV): Secondary | ICD-10-CM | POA: Diagnosis not present

## 2023-12-03 DIAGNOSIS — Z79899 Other long term (current) drug therapy: Secondary | ICD-10-CM

## 2023-12-03 DIAGNOSIS — Z01419 Encounter for gynecological examination (general) (routine) without abnormal findings: Secondary | ICD-10-CM | POA: Diagnosis not present

## 2023-12-04 ENCOUNTER — Ambulatory Visit: Payer: Self-pay | Admitting: *Deleted

## 2023-12-04 LAB — LIPID PANEL
Chol/HDL Ratio: 1.9 ratio (ref 0.0–4.4)
Cholesterol, Total: 175 mg/dL (ref 100–199)
HDL: 91 mg/dL (ref >39)
LDL Chol Calc (NIH): 75 mg/dL (ref 0–99)
Triglycerides: 40 mg/dL (ref 0–149)
VLDL Cholesterol Cal: 9 mg/dL (ref 5–40)

## 2023-12-04 LAB — HEPATIC FUNCTION PANEL
ALT: 17 IU/L (ref 0–32)
AST: 24 IU/L (ref 0–40)
Albumin: 4.3 g/dL (ref 3.8–4.9)
Alkaline Phosphatase: 45 IU/L (ref 44–121)
Bilirubin Total: 0.7 mg/dL (ref 0.0–1.2)
Bilirubin, Direct: 0.3 mg/dL (ref 0.00–0.40)
Total Protein: 6.7 g/dL (ref 6.0–8.5)

## 2023-12-10 ENCOUNTER — Ambulatory Visit: Payer: Self-pay | Admitting: Cardiology

## 2023-12-13 NOTE — Telephone Encounter (Signed)
 [facility] message with results, forwarded results to PCP.

## 2023-12-13 NOTE — Telephone Encounter (Signed)
-----   Message from Gaylyn Keas sent at 12/10/2023 10:56 PM EDT ----- Lipids at goal continue current therapy and forward to PCP

## 2024-03-08 ENCOUNTER — Other Ambulatory Visit: Payer: Self-pay | Admitting: Cardiology

## 2024-08-01 ENCOUNTER — Other Ambulatory Visit: Payer: Self-pay | Admitting: Cardiology

## 2024-08-13 ENCOUNTER — Other Ambulatory Visit: Payer: Self-pay | Admitting: Internal Medicine

## 2024-08-13 DIAGNOSIS — R1031 Right lower quadrant pain: Secondary | ICD-10-CM

## 2024-08-20 ENCOUNTER — Ambulatory Visit
Admission: RE | Admit: 2024-08-20 | Discharge: 2024-08-20 | Disposition: A | Discharge status: Home / Self Care | Source: Ambulatory Visit | Attending: Internal Medicine | Admitting: Internal Medicine

## 2024-08-20 DIAGNOSIS — R1031 Right lower quadrant pain: Secondary | ICD-10-CM

## 2024-08-20 MED ORDER — IOPAMIDOL (ISOVUE-300) INJECTION 61%
80.0000 mL | Freq: Once | INTRAVENOUS | Status: AC | PRN
  Administered 2024-08-20: 80 mL via INTRAVENOUS

## 2024-12-30 ENCOUNTER — Ambulatory Visit: Admitting: Cardiology
